# Patient Record
Sex: Female | Born: 1945 | Race: Black or African American | Hispanic: No | Marital: Single | Smoking: Current every day smoker
Health system: Southern US, Community
[De-identification: ages and names within clinical notes are randomized; demographics above are authoritative.]

## PROBLEM LIST (undated history)

## (undated) DIAGNOSIS — E049 Nontoxic goiter, unspecified: Secondary | ICD-10-CM

## (undated) DIAGNOSIS — K759 Inflammatory liver disease, unspecified: Secondary | ICD-10-CM

## (undated) DIAGNOSIS — K529 Noninfective gastroenteritis and colitis, unspecified: Secondary | ICD-10-CM

## (undated) DIAGNOSIS — K635 Polyp of colon: Secondary | ICD-10-CM

## (undated) DIAGNOSIS — I10 Essential (primary) hypertension: Secondary | ICD-10-CM

## (undated) DIAGNOSIS — K5909 Other constipation: Secondary | ICD-10-CM

## (undated) DIAGNOSIS — D649 Anemia, unspecified: Secondary | ICD-10-CM

## (undated) DIAGNOSIS — Z720 Tobacco use: Secondary | ICD-10-CM

## (undated) DIAGNOSIS — K8020 Calculus of gallbladder without cholecystitis without obstruction: Secondary | ICD-10-CM

## (undated) DIAGNOSIS — E785 Hyperlipidemia, unspecified: Secondary | ICD-10-CM

## (undated) HISTORY — PX: LIVER BIOPSY: SHX301

## (undated) HISTORY — PX: COLON SURGERY: SHX602

---

## 2009-12-29 ENCOUNTER — Ambulatory Visit: Payer: Self-pay | Admitting: Gastroenterology

## 2012-12-04 ENCOUNTER — Other Ambulatory Visit: Payer: Self-pay | Admitting: Gastroenterology

## 2012-12-04 LAB — CBC
HCT: 40 % (ref 35.0–47.0)
MCH: 27.7 pg (ref 26.0–34.0)
MCHC: 31.6 g/dL — ABNORMAL LOW (ref 32.0–36.0)
RBC: 4.56 10*6/uL (ref 3.80–5.20)

## 2012-12-04 LAB — APTT: Activated PTT: 35.5 secs (ref 23.6–35.9)

## 2012-12-05 ENCOUNTER — Ambulatory Visit: Payer: Self-pay | Admitting: Gastroenterology

## 2012-12-07 LAB — PATHOLOGY REPORT

## 2014-08-05 ENCOUNTER — Emergency Department: Payer: Self-pay | Admitting: Emergency Medicine

## 2014-08-05 LAB — COMPREHENSIVE METABOLIC PANEL
ALK PHOS: 114 U/L
Albumin: 3.9 g/dL (ref 3.4–5.0)
Anion Gap: 8 (ref 7–16)
BILIRUBIN TOTAL: 0.3 mg/dL (ref 0.2–1.0)
BUN: 12 mg/dL (ref 7–18)
CALCIUM: 9.1 mg/dL (ref 8.5–10.1)
Chloride: 105 mmol/L (ref 98–107)
Co2: 30 mmol/L (ref 21–32)
Creatinine: 0.82 mg/dL (ref 0.60–1.30)
Glucose: 93 mg/dL (ref 65–99)
OSMOLALITY: 284 (ref 275–301)
POTASSIUM: 3.6 mmol/L (ref 3.5–5.1)
SGOT(AST): 23 U/L (ref 15–37)
SGPT (ALT): 28 U/L
Sodium: 143 mmol/L (ref 136–145)
TOTAL PROTEIN: 8.6 g/dL — AB (ref 6.4–8.2)

## 2014-08-05 LAB — CBC WITH DIFFERENTIAL/PLATELET
BASOS ABS: 0 10*3/uL (ref 0.0–0.1)
Basophil %: 0.7 %
EOS ABS: 0.1 10*3/uL (ref 0.0–0.7)
Eosinophil %: 2.3 %
HCT: 41.5 % (ref 35.0–47.0)
HGB: 13.2 g/dL (ref 12.0–16.0)
Lymphocyte #: 2.1 10*3/uL (ref 1.0–3.6)
Lymphocyte %: 37.7 %
MCH: 28.3 pg (ref 26.0–34.0)
MCHC: 31.9 g/dL — ABNORMAL LOW (ref 32.0–36.0)
MCV: 89 fL (ref 80–100)
MONOS PCT: 8.8 %
Monocyte #: 0.5 x10 3/mm (ref 0.2–0.9)
NEUTROS PCT: 50.5 %
Neutrophil #: 2.9 10*3/uL (ref 1.4–6.5)
PLATELETS: 230 10*3/uL (ref 150–440)
RBC: 4.68 10*6/uL (ref 3.80–5.20)
RDW: 13.4 % (ref 11.5–14.5)
WBC: 5.7 10*3/uL (ref 3.6–11.0)

## 2014-08-05 LAB — URINALYSIS, COMPLETE
BILIRUBIN, UR: NEGATIVE
Bacteria: NONE SEEN
Glucose,UR: NEGATIVE mg/dL (ref 0–75)
Hyaline Cast: 1
Ketone: NEGATIVE
NITRITE: NEGATIVE
PROTEIN: NEGATIVE
Ph: 7 (ref 4.5–8.0)
RBC,UR: 9 /HPF (ref 0–5)
Specific Gravity: 1.012 (ref 1.003–1.030)

## 2014-08-05 LAB — LIPASE, BLOOD: LIPASE: 162 U/L (ref 73–393)

## 2014-10-31 ENCOUNTER — Ambulatory Visit: Payer: Self-pay | Admitting: Internal Medicine

## 2014-10-31 LAB — SEDIMENTATION RATE: Erythrocyte Sed Rate: 43 mm/hr — ABNORMAL HIGH (ref 0–30)

## 2014-11-05 ENCOUNTER — Ambulatory Visit: Payer: Self-pay | Admitting: Physician Assistant

## 2014-11-09 ENCOUNTER — Emergency Department: Payer: Self-pay | Admitting: Emergency Medicine

## 2014-11-09 LAB — CBC WITH DIFFERENTIAL/PLATELET
Basophil #: 0.3 10*3/uL — ABNORMAL HIGH (ref 0.0–0.1)
Basophil %: 3.4 %
EOS PCT: 4.2 %
Eosinophil #: 0.4 10*3/uL (ref 0.0–0.7)
HCT: 39.7 % (ref 35.0–47.0)
HGB: 13 g/dL (ref 12.0–16.0)
Lymphocyte #: 0.8 10*3/uL — ABNORMAL LOW (ref 1.0–3.6)
Lymphocyte %: 8.7 %
MCH: 28.6 pg (ref 26.0–34.0)
MCHC: 32.7 g/dL (ref 32.0–36.0)
MCV: 88 fL (ref 80–100)
Monocyte #: 0.6 x10 3/mm (ref 0.2–0.9)
Monocyte %: 6 %
Neutrophil #: 7.5 10*3/uL — ABNORMAL HIGH (ref 1.4–6.5)
Neutrophil %: 77.7 %
PLATELETS: 257 10*3/uL (ref 150–440)
RBC: 4.54 10*6/uL (ref 3.80–5.20)
RDW: 14 % (ref 11.5–14.5)
WBC: 9.6 10*3/uL (ref 3.6–11.0)

## 2014-11-09 LAB — URINALYSIS, COMPLETE
Bilirubin,UR: NEGATIVE
Glucose,UR: NEGATIVE mg/dL (ref 0–75)
KETONE: NEGATIVE
Nitrite: NEGATIVE
Ph: 6 (ref 4.5–8.0)
Protein: NEGATIVE
RBC,UR: 6 /HPF (ref 0–5)
Specific Gravity: 1.005 (ref 1.003–1.030)
Squamous Epithelial: 1
WBC UR: 2 /HPF (ref 0–5)

## 2014-11-09 LAB — COMPREHENSIVE METABOLIC PANEL
Albumin: 3.7 g/dL (ref 3.4–5.0)
Alkaline Phosphatase: 118 U/L — ABNORMAL HIGH
Anion Gap: 8 (ref 7–16)
BUN: 17 mg/dL (ref 7–18)
Bilirubin,Total: 0.3 mg/dL (ref 0.2–1.0)
CALCIUM: 8.7 mg/dL (ref 8.5–10.1)
CO2: 27 mmol/L (ref 21–32)
Chloride: 104 mmol/L (ref 98–107)
Creatinine: 1.05 mg/dL (ref 0.60–1.30)
EGFR (African American): >60
EGFR (Non-African Amer.): 55 — ABNORMAL LOW
Glucose: 106 mg/dL — ABNORMAL HIGH (ref 65–99)
Osmolality: 280 (ref 275–301)
Potassium: 3.2 mmol/L — ABNORMAL LOW (ref 3.5–5.1)
SGOT(AST): 46 U/L — ABNORMAL HIGH (ref 15–37)
SGPT (ALT): 47 U/L
SODIUM: 139 mmol/L (ref 136–145)
Total Protein: 8.8 g/dL — ABNORMAL HIGH (ref 6.4–8.2)

## 2014-11-09 LAB — MAGNESIUM: MAGNESIUM: 1.8 mg/dL

## 2014-11-09 LAB — LIPASE, BLOOD: Lipase: 241 U/L (ref 73–393)

## 2014-11-11 ENCOUNTER — Ambulatory Visit: Payer: Self-pay | Admitting: Physician Assistant

## 2014-11-11 LAB — URINALYSIS, COMPLETE
Bacteria: NEGATIVE
Bilirubin,UR: NEGATIVE
GLUCOSE, UR: NEGATIVE
Ketone: NEGATIVE
NITRITE: NEGATIVE
PROTEIN: NEGATIVE
Ph: 5.5 (ref 5.0–8.0)
SPECIFIC GRAVITY: 1.01 (ref 1.000–1.030)
Squamous Epithelial: NONE SEEN
WBC UR: NONE SEEN /HPF (ref 0–5)

## 2014-11-11 LAB — URINE CULTURE

## 2014-11-13 LAB — URINE CULTURE

## 2014-11-18 ENCOUNTER — Emergency Department: Payer: Self-pay | Admitting: Emergency Medicine

## 2014-11-19 LAB — URINALYSIS, COMPLETE
BILIRUBIN, UR: NEGATIVE
Bacteria: NONE SEEN
Glucose,UR: NEGATIVE mg/dL (ref 0–75)
KETONE: NEGATIVE
Leukocyte Esterase: NEGATIVE
Nitrite: NEGATIVE
PH: 6 (ref 4.5–8.0)
PROTEIN: NEGATIVE
RBC,UR: 3 /HPF (ref 0–5)
SPECIFIC GRAVITY: 1.003 (ref 1.003–1.030)
Squamous Epithelial: NONE SEEN
WBC UR: <1 /HPF (ref 0–5)

## 2014-11-19 LAB — COMPREHENSIVE METABOLIC PANEL
ALBUMIN: 4.1 g/dL (ref 3.4–5.0)
AST: 41 U/L — AB (ref 15–37)
Alkaline Phosphatase: 108 U/L
Anion Gap: 8 (ref 7–16)
BUN: 14 mg/dL (ref 7–18)
Bilirubin,Total: 0.4 mg/dL (ref 0.2–1.0)
CHLORIDE: 101 mmol/L (ref 98–107)
CO2: 27 mmol/L (ref 21–32)
CREATININE: 1.04 mg/dL (ref 0.60–1.30)
Calcium, Total: 9.1 mg/dL (ref 8.5–10.1)
EGFR (African American): >60
EGFR (Non-African Amer.): 56 — ABNORMAL LOW
Glucose: 90 mg/dL (ref 65–99)
OSMOLALITY: 272 (ref 275–301)
POTASSIUM: 3.3 mmol/L — AB (ref 3.5–5.1)
SGPT (ALT): 30 U/L
SODIUM: 136 mmol/L (ref 136–145)
TOTAL PROTEIN: 9.1 g/dL — AB (ref 6.4–8.2)

## 2014-11-19 LAB — CBC
HCT: 37.9 % (ref 35.0–47.0)
HGB: 12.2 g/dL (ref 12.0–16.0)
MCH: 28.4 pg (ref 26.0–34.0)
MCHC: 32.1 g/dL (ref 32.0–36.0)
MCV: 88 fL (ref 80–100)
PLATELETS: 306 10*3/uL (ref 150–440)
RBC: 4.29 10*6/uL (ref 3.80–5.20)
RDW: 13.8 % (ref 11.5–14.5)
WBC: 9.4 10*3/uL (ref 3.6–11.0)

## 2014-11-19 LAB — TSH: THYROID STIMULATING HORM: 5.17 u[IU]/mL — AB

## 2014-11-19 LAB — ETHANOL: Ethanol: <3 mg/dL

## 2014-11-19 LAB — TROPONIN I

## 2014-11-22 ENCOUNTER — Emergency Department: Payer: Self-pay | Admitting: Emergency Medicine

## 2014-11-27 ENCOUNTER — Ambulatory Visit: Payer: Self-pay | Admitting: Family Medicine

## 2014-12-03 ENCOUNTER — Ambulatory Visit: Payer: Self-pay | Admitting: Family Medicine

## 2014-12-23 ENCOUNTER — Ambulatory Visit: Payer: Self-pay | Admitting: Physician Assistant

## 2014-12-23 LAB — URINALYSIS, COMPLETE
BACTERIA: NEGATIVE
Bilirubin,UR: NEGATIVE
Glucose,UR: NEGATIVE
KETONE: NEGATIVE
NITRITE: NEGATIVE
PROTEIN: NEGATIVE
Ph: 5 (ref 5.0–8.0)
Specific Gravity: 1.005 (ref 1.000–1.030)

## 2014-12-23 LAB — RAPID STREP-A WITH REFLX: Micro Text Report: NEGATIVE

## 2014-12-26 LAB — BETA STREP CULTURE(ARMC)

## 2015-01-17 ENCOUNTER — Ambulatory Visit: Payer: Self-pay | Admitting: Family Medicine

## 2015-01-21 ENCOUNTER — Ambulatory Visit: Payer: Self-pay | Admitting: Family Medicine

## 2015-03-14 ENCOUNTER — Encounter: Payer: Self-pay | Admitting: Family Medicine

## 2015-03-14 DIAGNOSIS — R202 Paresthesia of skin: Secondary | ICD-10-CM

## 2015-03-14 DIAGNOSIS — Z789 Other specified health status: Secondary | ICD-10-CM

## 2015-03-14 DIAGNOSIS — I10 Essential (primary) hypertension: Secondary | ICD-10-CM | POA: Insufficient documentation

## 2015-03-14 DIAGNOSIS — I872 Venous insufficiency (chronic) (peripheral): Secondary | ICD-10-CM | POA: Insufficient documentation

## 2015-03-14 DIAGNOSIS — E559 Vitamin D deficiency, unspecified: Secondary | ICD-10-CM | POA: Insufficient documentation

## 2015-03-26 NOTE — Consult Note (Signed)
PATIENT NAME:  Angela Yates, Angela Yates MR#:  [id number] DATE OF BIRTH:  1946-07-21  DATE OF CONSULTATION:  11/20/2014  REFERRING PHYSICIAN:   CONSULTING PHYSICIAN:  Gonzella Lex, MD  IDENTIFYING INFORMATION AND REASON FOR CONSULT: A 69 year old woman who came voluntarily to the Emergency Room.   CHIEF COMPLAINT: Feeling like she has worms crawling in her.    HISTORY OF PRESENT ILLNESS: Information obtained from the patient and less so from the chart. She did not come with any referral paperwork, she is not under an IVC. She says that she went to the walk-in clinic and they referred her to come to the Emergency Room. She says that she has a feeling like there are worms crawling under her skin. It is not constant, but will last for 30-60 minutes a day. Some days it is worse than others. Altogether it has been going on for a month. She notices that if she drinks alcohol it makes it better. She has not talked to a doctor about it until now. The patient denies being depressed. Denies suicidal ideation. Denies auditory hallucinations. No other psychiatric symptoms reported. Does not have any physical symptoms she is reporting either. She says that she takes hydrochlorothiazide and potassium and lisinopril, does not know of any other medicines. Denies that she uses any other drugs besides the alcohol. Says she drinks about 1 small bottle like an airplane-size bottle a day and not more than that.   PAST PSYCHIATRIC HISTORY: Denies any history of psychiatric problems. Never been in a psychiatric hospital. Denies suicide attempts or violence. Denies any past history of hallucinations. Never been on any psychiatric medicines.   FAMILY HISTORY: Denies family history of mental illness.   SOCIAL HISTORY: The patient lives by herself in an apartment. Has several adult children, 1 of whom, a daughter checks in on her with some regularity.   PAST MEDICAL HISTORY: High blood pressure is the only condition she knows she  has. Denies any history of stroke or MI.    SUBSTANCE ABUSE HISTORY: Says she drinks about 1 single 1 ounce drink of whiskey a day, does not drink more than that, has never thought it is a problem. No history of DTs or seizures. Denies use of any other drugs.   REVIEW OF SYSTEMS: The only symptom she is complaining of to me is feeling like there are worms crawling under her skin. Denies any other physical symptoms. No mood symptoms. No psychotic symptoms otherwise.   MENTAL STATUS EXAMINATION: Slightly disheveled woman, looks her stated age, cooperative with the interview. Eye contact is intermittent. She does have an odd gesture of squinting and making odd facial gestures intermittently. When this is pointed out to her she cannot really give any account of it. Otherwise her speech is normal in rate, tone, and volume. Her affect is blunted. Mood is stated as all right. Thoughts sometimes a little scattered. She will go on and on at some length until I stop her with a tangential answer. Did not make any obviously bizarre or paranoid statements talking with me. It was reported earlier that she said she was pregnant, she did not report that to me. She denies suicidal or homicidal ideation. Denies auditory or visual hallucinations. She could recall 3 out of 3 objects immediately, 3 out of 3 at 3 minutes. She was alert and oriented x 4. Judgment and insight seem reasonably intact. Baseline intelligence probably average to low average.   LABORATORY RESULTS: Chemistry panel shows a low  potassium of 3.3, otherwise unremarkable. CBC is all normal. TSH elevated at 5.1. EKG, possible left atrial enlargement or ventricular hypertrophy. Nothing that looks related to the psychiatric problems. Blood in the urine. A head CT which was obtained shows nothing acute, mild small vessel disease.   VITAL SIGNS:  Currently blood pressure 155/72, respirations 18, pulse 79, temperature 98.7.   ASSESSMENT: A 69 year old woman with  a history of no past psychiatric problems that we know of, presents with tactile sensations. Possibly hallucinations, possibly caused by something else. At this point I do not have any evidence that she is giving of any specific psychiatric problem. She does not report any mood symptoms. It is possible this could be part of a long-standing psychotic disorder, but without any past history or other symptoms I cannot really make that diagnosis. At this point she does not seem to be dangerous, does not require hospital treatment or have enough symptoms to warrant any specific medication.   TREATMENT PLAN: The patient is encouraged that whatever this is, it does not seem to be immediately life-threatening and that she should be discharged from the Emergency Room. She is encouraged to follow up with her primary care doctor. I also strongly encouraged her to stop the use of alcohol. I have a little bit of a suspicion that this could be related to the alcohol and may be alcohol withdrawal. This was explained to the patient and she was encouraged to stop drinking entirely. Case discussed with Emergency Room doctor. No further treatment at this point.   DIAGNOSIS PRINCIPAL AND PRIMARY:   AXIS I: Psychosis not otherwise specified.     SECONDARY DIAGNOSES:   AXIS I: No further.   AXIS II: No diagnosis.   AXIS III: High blood pressure.    ____________________________ Gonzella Lex, MD jtc:bu D: 11/20/2014 14:17:02 ET T: 11/20/2014 14:49:41 ET JOB#: [id number]  cc: Gonzella Lex, MD, <Dictator> Gonzella Lex MD ELECTRONICALLY SIGNED 12/02/2014 15:14

## 2015-08-11 ENCOUNTER — Encounter: Payer: Self-pay | Admitting: Emergency Medicine

## 2015-08-11 ENCOUNTER — Emergency Department
Admission: EM | Admit: 2015-08-11 | Discharge: 2015-08-11 | Disposition: A | Discharge status: Home / Self Care | Payer: Medicare HMO | Attending: Emergency Medicine | Admitting: Emergency Medicine

## 2015-08-11 DIAGNOSIS — R1032 Left lower quadrant pain: Secondary | ICD-10-CM

## 2015-08-11 DIAGNOSIS — Z79899 Other long term (current) drug therapy: Secondary | ICD-10-CM | POA: Diagnosis not present

## 2015-08-11 DIAGNOSIS — I10 Essential (primary) hypertension: Secondary | ICD-10-CM | POA: Diagnosis not present

## 2015-08-11 DIAGNOSIS — N390 Urinary tract infection, site not specified: Secondary | ICD-10-CM | POA: Insufficient documentation

## 2015-08-11 DIAGNOSIS — Z720 Tobacco use: Secondary | ICD-10-CM | POA: Insufficient documentation

## 2015-08-11 HISTORY — DX: Essential (primary) hypertension: I10

## 2015-08-11 LAB — CBC WITH DIFFERENTIAL/PLATELET
BASOS ABS: 0 10*3/uL (ref 0–0.1)
BASOS PCT: 1 %
Eosinophils Absolute: 0.1 10*3/uL (ref 0–0.7)
Eosinophils Relative: 2 %
HEMATOCRIT: 37.8 % (ref 35.0–47.0)
HEMOGLOBIN: 12.2 g/dL (ref 12.0–16.0)
Lymphocytes Relative: 25 %
Lymphs Abs: 1.7 10*3/uL (ref 1.0–3.6)
MCH: 28.1 pg (ref 26.0–34.0)
MCHC: 32.4 g/dL (ref 32.0–36.0)
MCV: 86.6 fL (ref 80.0–100.0)
Monocytes Absolute: 0.8 10*3/uL (ref 0.2–0.9)
Monocytes Relative: 11 %
NEUTROS ABS: 4.2 10*3/uL (ref 1.4–6.5)
NEUTROS PCT: 61 %
Platelets: 254 10*3/uL (ref 150–440)
RBC: 4.36 MIL/uL (ref 3.80–5.20)
RDW: 14.1 % (ref 11.5–14.5)
WBC: 6.9 10*3/uL (ref 3.6–11.0)

## 2015-08-11 LAB — URINALYSIS COMPLETE WITH MICROSCOPIC (ARMC ONLY)
Bilirubin Urine: NEGATIVE
Glucose, UA: NEGATIVE mg/dL
Ketones, ur: NEGATIVE mg/dL
Nitrite: NEGATIVE
PH: 5 (ref 5.0–8.0)
PROTEIN: NEGATIVE mg/dL
SPECIFIC GRAVITY, URINE: 1.008 (ref 1.005–1.030)

## 2015-08-11 LAB — COMPREHENSIVE METABOLIC PANEL
ALBUMIN: 4.3 g/dL (ref 3.5–5.0)
ALK PHOS: 63 U/L (ref 38–126)
ALT: 18 U/L (ref 14–54)
AST: 27 U/L (ref 15–41)
Anion gap: 9 (ref 5–15)
BILIRUBIN TOTAL: 0.6 mg/dL (ref 0.3–1.2)
BUN: 17 mg/dL (ref 6–20)
CO2: 23 mmol/L (ref 22–32)
Calcium: 8.9 mg/dL (ref 8.9–10.3)
Chloride: 101 mmol/L (ref 101–111)
Creatinine, Ser: 1.11 mg/dL — ABNORMAL HIGH (ref 0.44–1.00)
GFR calc Af Amer: 57 mL/min — ABNORMAL LOW (ref >60)
GFR calc non Af Amer: 49 mL/min — ABNORMAL LOW (ref >60)
GLUCOSE: 139 mg/dL — AB (ref 65–99)
POTASSIUM: 3.1 mmol/L — AB (ref 3.5–5.1)
Sodium: 133 mmol/L — ABNORMAL LOW (ref 135–145)
TOTAL PROTEIN: 8.2 g/dL — AB (ref 6.5–8.1)

## 2015-08-11 LAB — LIPASE, BLOOD: Lipase: 39 U/L (ref 22–51)

## 2015-08-11 MED ORDER — CEPHALEXIN 500 MG PO CAPS: 500.0000 mg | ORAL_CAPSULE | Freq: Three times a day (TID) | ORAL | Status: AC

## 2015-08-11 MED ORDER — CEPHALEXIN 500 MG PO CAPS
500.0000 mg | ORAL_CAPSULE | Freq: Once | ORAL | Status: AC
  Administered 2015-08-11: 500 mg via ORAL
  Filled 2015-08-11 (×2): qty 1

## 2015-08-11 NOTE — Discharge Instructions (Signed)
Abdominal Pain °Many things can cause abdominal pain. Usually, abdominal pain is not caused by a disease and will improve without treatment. It can often be observed and treated at home. Your health care provider will do a physical exam and possibly order blood tests and X-rays to help determine the seriousness of your pain. However, in many cases, more time must pass before a clear cause of the pain can be found. Before that point, your health care provider may not know if you need more testing or further treatment. °HOME CARE INSTRUCTIONS  °Monitor your abdominal pain for any changes. The following actions may help to alleviate any discomfort you are experiencing: °· Only take over-the-counter or prescription medicines as directed by your health care provider. °· Do not take laxatives unless directed to do so by your health care provider. °· Try a clear liquid diet (broth, tea, or water) as directed by your health care provider. Slowly move to a bland diet as tolerated. °SEEK MEDICAL CARE IF: °· You have unexplained abdominal pain. °· You have abdominal pain associated with nausea or diarrhea. °· You have pain when you urinate or have a bowel movement. °· You experience abdominal pain that wakes you in the night. °· You have abdominal pain that is worsened or improved by eating food. °· You have abdominal pain that is worsened with eating fatty foods. °· You have a fever. °SEEK IMMEDIATE MEDICAL CARE IF:  °· Your pain does not go away within 2 hours. °· You keep throwing up (vomiting). °· Your pain is felt only in portions of the abdomen, such as the right side or the left lower portion of the abdomen. °· You pass bloody or black tarry stools. °MAKE SURE YOU: °· Understand these instructions.   °· Will watch your condition.   °· Will get help right away if you are not doing well or get worse.   °Document Released: 08/25/2005 Document Revised: 11/20/2013 Document Reviewed: 07/25/2013 °[employer]® Patient Information  ©2015 [employer]. This information is not intended to replace advice given to you by your health care provider. Make sure you discuss any questions you have with your health care provider. °Urinary Tract Infection °Urinary tract infections (UTIs) can develop anywhere along your urinary tract. Your urinary tract is your body's drainage system for removing wastes and extra water. Your urinary tract includes two kidneys, two ureters, a bladder, and a urethra. Your kidneys are a pair of bean-shaped organs. Each kidney is about the size of your fist. They are located below your ribs, one on each side of your spine. °CAUSES °Infections are caused by microbes, which are microscopic organisms, including fungi, viruses, and bacteria. These organisms are so small that they can only be seen through a microscope. Bacteria are the microbes that most commonly cause UTIs. °SYMPTOMS  °Symptoms of UTIs may vary by age and gender of the patient and by the location of the infection. Symptoms in young women typically include a frequent and intense urge to urinate and a painful, burning feeling in the bladder or urethra during urination. Older women and men are more likely to be tired, shaky, and weak and have muscle aches and abdominal pain. A fever may mean the infection is in your kidneys. Other symptoms of a kidney infection include pain in your back or sides below the ribs, nausea, and vomiting. °DIAGNOSIS °To diagnose a UTI, your caregiver will ask you about your symptoms. Your caregiver also will ask to provide a urine sample. The urine sample   will be tested for bacteria and white blood cells. White blood cells are made by your body to help fight infection. °TREATMENT  °Typically, UTIs can be treated with medication. Because most UTIs are caused by a bacterial infection, they usually can be treated with the use of antibiotics. The choice of antibiotic and length of treatment depend on your symptoms and the type of bacteria  causing your infection. °HOME CARE INSTRUCTIONS °· If you were prescribed antibiotics, take them exactly as your caregiver instructs you. Finish the medication even if you feel better after you have only taken some of the medication. °· Drink enough water and fluids to keep your urine clear or pale yellow. °· Avoid caffeine, tea, and carbonated beverages. They tend to irritate your bladder. °· Empty your bladder often. Avoid holding urine for long periods of time. °· Empty your bladder before and after sexual intercourse. °· After a bowel movement, women should cleanse from front to back. Use each tissue only once. °SEEK MEDICAL CARE IF:  °· You have back pain. °· You develop a fever. °· Your symptoms do not begin to resolve within 3 days. °SEEK IMMEDIATE MEDICAL CARE IF:  °· You have severe back pain or lower abdominal pain. °· You develop chills. °· You have nausea or vomiting. °· You have continued burning or discomfort with urination. °MAKE SURE YOU:  °· Understand these instructions. °· Will watch your condition. °· Will get help right away if you are not doing well or get worse. °Document Released: 08/25/2005 Document Revised: 05/16/2012 Document Reviewed: 12/24/2011 °ExitCare® Patient Information ©2015 [employer]. This information is not intended to replace advice given to you by your health care provider. Make sure you discuss any questions you have with your health care provider. ° °

## 2015-08-11 NOTE — ED Notes (Signed)
Pt denies NVD and denies changes in urine.

## 2015-08-11 NOTE — ED Notes (Signed)
Pt here with c/o lower abd pain. Pt stated that she has had pain for a week, pt increased today. Not been eating well.  Pt denies n/v/d.  Pt states that she had a BM while in the waiting room and it was normal.  Pt in NAD at this time and was ambulatory to traige room.

## 2015-08-11 NOTE — ED Notes (Signed)
Pt is very scattered when talking with this Probation officer.  Pt denies using drugs or taking any meds for mental health issues.

## 2015-08-11 NOTE — ED Provider Notes (Signed)
[facility] Emergency Department Provider Note  ____________________________________________  Time seen: Approximately 350 PM  I have reviewed the triage vital signs and the nursing notes.   HISTORY  Chief Complaint Abdominal Pain    HPI Angela Yates is a 69 y.o. female with a history of hypertension who is complaining of one week of left lower quadrant abdominal pain. She denies any nausea vomiting or diarrhea. She says that she does have a small amount of burning after urinating. Denies any urinary frequency. Denies any vaginal discharge or bleeding. Says that the pain feels sharp. Denies any pain at this time. Says she had 2 bowel movements earlier today and the pain is now gone. Denies any chest pain.   Past Medical History  Diagnosis Date  . Hypertension     Patient Active Problem List   Diagnosis Date Noted  . Hypertension, essential 03/14/2015  . Vitamin D deficiency 03/14/2015  . 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 Formication 03/14/2015  . Poor historian 03/14/2015    History reviewed. No pertinent past surgical history.  Current Outpatient Rx  Name  Route  Sig  Dispense  Refill  . cholecalciferol (VITAMIN D) 1000 UNITS tablet   Oral   Take 2,000 Units by mouth daily.         . hydrochlorothiazide (HYDRODIURIL) 25 MG tablet   Oral   Take 25 mg by mouth daily.           Allergies Review of patient's allergies indicates no known allergies.  No family history on file.  Social History Social History  Substance Use Topics  . Smoking status: Current Every Day Smoker  . Smokeless tobacco: None  . Alcohol Use: Yes    Review of Systems Constitutional: No fever/chills Eyes: No visual changes. ENT: No sore throat. Cardiovascular: Denies chest pain. Respiratory: Denies shortness of breath. Gastrointestinal:  No nausea, no vomiting.  No diarrhea.  No constipation. Genitourinary:  As above Musculoskeletal: Negative for back pain. Skin: Negative for rash. Neurological: Negative for headaches, focal weakness or numbness.  10-point ROS otherwise negative.  ____________________________________________   PHYSICAL EXAM:  VITAL SIGNS: ED Triage Vitals  Enc Vitals Group     BP 08/11/15 1323 134/80 mmHg     Pulse Rate 08/11/15 1323 95     Resp 08/11/15 1323 18     Temp 08/11/15 1323 98.9 F (37.2 C)     Temp Source 08/11/15 1323 Oral     SpO2 08/11/15 1323 95 %     Weight 08/11/15 1323 165 lb (74.844 kg)     Height --      Head Cir --      Peak Flow --      Pain Score --      Pain Loc --      Pain Edu? --      Excl. in [place]? --     Constitutional: Alert and oriented. Well appearing and in no acute distress. Eyes: Conjunctivae are normal. PERRL. EOMI. Head: Atraumatic. Nose: No congestion/rhinnorhea. Mouth/Throat: Mucous membranes are moist.  Oropharynx non-erythematous. Neck: No stridor.   Cardiovascular: Normal rate, regular rhythm. Grossly normal heart sounds.  Good peripheral circulation. Respiratory: Normal respiratory effort.  No retractions. Lungs CTAB. Gastrointestinal: Soft and nontender. No distention. No abdominal bruits. No CVA tenderness. Musculoskeletal: No lower extremity tenderness nor edema.  No joint effusions. Neurologic:  Normal speech and language. No gross focal neurologic deficits are appreciated. No gait  instability. Skin:  Skin is warm, dry and intact. No rash noted. Psychiatric: Mood and affect are normal. Speech and behavior are normal.  ____________________________________________   LABS (all labs ordered are listed, but only abnormal results are displayed)  Labs Reviewed  COMPREHENSIVE METABOLIC PANEL - Abnormal; Notable for the following:    Sodium 133 (*)    Potassium 3.1 (*)    Glucose, Bld 139 (*)    Creatinine, Ser 1.11 (*)    Total Protein 8.2 (*)    GFR calc non Af Amer 49 (*)    GFR calc Af Amer 57 (*)    All  other components within normal limits  URINALYSIS COMPLETEWITH MICROSCOPIC ([facility] ONLY) - Abnormal; Notable for the following:    Color, Urine YELLOW (*)    APPearance HAZY (*)    Hgb urine dipstick 1+ (*)    Leukocytes, UA 3+ (*)    Bacteria, UA MANY (*)    Squamous Epithelial / LPF 0-5 (*)    All other components within normal limits  URINE CULTURE  CBC WITH DIFFERENTIAL/PLATELET  LIPASE, BLOOD   ____________________________________________  EKG  ED ECG REPORT I, Doran Stabler, the attending physician, personally viewed and interpreted this ECG.   Date: 08/11/2015  EKG Time: 1338  Rate: 106  Rhythm: sinus tachycardia  Axis: Normal axis  Intervals:none  ST&T Change: No ST elevations or depressions. No abnormal T-wave inversions.  ____________________________________________  RADIOLOGY   ____________________________________________   PROCEDURES    ____________________________________________   INITIAL IMPRESSION / ASSESSMENT AND PLAN / ED COURSE  Pertinent labs & imaging results that were available during my care of the patient were reviewed by me and considered in my medical decision making (see chart for details).  ----------------------------------------- 4:34 PM on 08/11/2015 -----------------------------------------  Patient is resting comfortable at this time. Discussed lab results including positive urinalysis. We'll discharge with Keflex. ____________________________________________   FINAL CLINICAL IMPRESSION(S) / ED DIAGNOSES  Acute abdominal pain. Acute urinary tract infection. Initial visit.    Orbie Pyo, MD 08/11/15 [phone]

## 2015-08-13 LAB — URINE CULTURE

## 2015-10-29 ENCOUNTER — Other Ambulatory Visit: Payer: Self-pay | Admitting: Family Medicine

## 2015-10-29 DIAGNOSIS — Z1231 Encounter for screening mammogram for malignant neoplasm of breast: Secondary | ICD-10-CM

## 2015-10-29 DIAGNOSIS — R935 Abnormal findings on diagnostic imaging of other abdominal regions, including retroperitoneum: Secondary | ICD-10-CM

## 2015-11-03 ENCOUNTER — Ambulatory Visit
Admission: RE | Admit: 2015-11-03 | Discharge: 2015-11-03 | Disposition: A | Discharge status: Home / Self Care | Payer: Medicare HMO | Source: Ambulatory Visit | Attending: Family Medicine | Admitting: Family Medicine

## 2015-11-03 DIAGNOSIS — R1032 Left lower quadrant pain: Secondary | ICD-10-CM | POA: Diagnosis not present

## 2015-11-03 DIAGNOSIS — R935 Abnormal findings on diagnostic imaging of other abdominal regions, including retroperitoneum: Secondary | ICD-10-CM

## 2015-11-11 ENCOUNTER — Ambulatory Visit
Admission: RE | Admit: 2015-11-11 | Discharge: 2015-11-11 | Disposition: A | Discharge status: Home / Self Care | Payer: Medicare HMO | Source: Ambulatory Visit | Attending: Family Medicine | Admitting: Family Medicine

## 2015-11-11 DIAGNOSIS — Z1231 Encounter for screening mammogram for malignant neoplasm of breast: Secondary | ICD-10-CM | POA: Diagnosis not present

## 2016-01-07 ENCOUNTER — Emergency Department: Payer: Medicare HMO

## 2016-01-07 ENCOUNTER — Encounter: Payer: Self-pay | Admitting: Emergency Medicine

## 2016-01-07 ENCOUNTER — Emergency Department
Admission: EM | Admit: 2016-01-07 | Discharge: 2016-01-07 | Disposition: A | Discharge status: Home / Self Care | Payer: Medicare HMO | Attending: Emergency Medicine | Admitting: Emergency Medicine

## 2016-01-07 DIAGNOSIS — M79673 Pain in unspecified foot: Secondary | ICD-10-CM | POA: Insufficient documentation

## 2016-01-07 DIAGNOSIS — Z79899 Other long term (current) drug therapy: Secondary | ICD-10-CM | POA: Insufficient documentation

## 2016-01-07 DIAGNOSIS — G8929 Other chronic pain: Secondary | ICD-10-CM | POA: Diagnosis not present

## 2016-01-07 DIAGNOSIS — R1030 Lower abdominal pain, unspecified: Secondary | ICD-10-CM | POA: Diagnosis not present

## 2016-01-07 DIAGNOSIS — R197 Diarrhea, unspecified: Secondary | ICD-10-CM

## 2016-01-07 DIAGNOSIS — F17200 Nicotine dependence, unspecified, uncomplicated: Secondary | ICD-10-CM | POA: Insufficient documentation

## 2016-01-07 DIAGNOSIS — I10 Essential (primary) hypertension: Secondary | ICD-10-CM | POA: Insufficient documentation

## 2016-01-07 LAB — COMPREHENSIVE METABOLIC PANEL
ALK PHOS: 76 U/L (ref 38–126)
ALT: 28 U/L (ref 14–54)
AST: 28 U/L (ref 15–41)
Albumin: 4.5 g/dL (ref 3.5–5.0)
Anion gap: 8 (ref 5–15)
BILIRUBIN TOTAL: 0.8 mg/dL (ref 0.3–1.2)
BUN: 24 mg/dL — AB (ref 6–20)
CHLORIDE: 102 mmol/L (ref 101–111)
CO2: 25 mmol/L (ref 22–32)
Calcium: 9.2 mg/dL (ref 8.9–10.3)
Creatinine, Ser: 1.13 mg/dL — ABNORMAL HIGH (ref 0.44–1.00)
GFR calc Af Amer: 55 mL/min — ABNORMAL LOW (ref >60)
GFR, EST NON AFRICAN AMERICAN: 48 mL/min — AB (ref >60)
GLUCOSE: 101 mg/dL — AB (ref 65–99)
Potassium: 3.5 mmol/L (ref 3.5–5.1)
Sodium: 135 mmol/L (ref 135–145)
Total Protein: 8.9 g/dL — ABNORMAL HIGH (ref 6.5–8.1)

## 2016-01-07 LAB — CBC WITH DIFFERENTIAL/PLATELET
Basophils Absolute: 0 10*3/uL (ref 0–0.1)
Basophils Relative: 1 %
Eosinophils Absolute: 0.1 10*3/uL (ref 0–0.7)
Eosinophils Relative: 2 %
HEMATOCRIT: 37.8 % (ref 35.0–47.0)
HEMOGLOBIN: 12.4 g/dL (ref 12.0–16.0)
LYMPHS ABS: 1.5 10*3/uL (ref 1.0–3.6)
LYMPHS PCT: 28 %
MCH: 28.1 pg (ref 26.0–34.0)
MCHC: 32.8 g/dL (ref 32.0–36.0)
MCV: 85.5 fL (ref 80.0–100.0)
MONOS PCT: 9 %
Monocytes Absolute: 0.5 10*3/uL (ref 0.2–0.9)
NEUTROS ABS: 3.2 10*3/uL (ref 1.4–6.5)
NEUTROS PCT: 60 %
Platelets: 255 10*3/uL (ref 150–440)
RBC: 4.42 MIL/uL (ref 3.80–5.20)
RDW: 13.7 % (ref 11.5–14.5)
WBC: 5.4 10*3/uL (ref 3.6–11.0)

## 2016-01-07 LAB — LIPASE, BLOOD: Lipase: 45 U/L (ref 11–51)

## 2016-01-07 LAB — TROPONIN I: Troponin I: <0.03 ng/mL (ref <0.031)

## 2016-01-07 LAB — LACTIC ACID, PLASMA: Lactic Acid, Venous: 1.3 mmol/L (ref 0.5–2.0)

## 2016-01-07 MED ORDER — IOHEXOL 300 MG/ML  SOLN
100.0000 mL | Freq: Once | INTRAMUSCULAR | Status: AC | PRN
  Administered 2016-01-07: 100 mL via INTRAVENOUS

## 2016-01-07 MED ORDER — ONDANSETRON HCL 4 MG PO TABS: 4.0000 mg | ORAL_TABLET | Freq: Every day | ORAL | Status: DC | PRN

## 2016-01-07 MED ORDER — IOHEXOL 240 MG/ML SOLN
25.0000 mL | Freq: Once | INTRAMUSCULAR | Status: AC | PRN
  Administered 2016-01-07: 25 mL via ORAL

## 2016-01-07 MED ORDER — SODIUM CHLORIDE 0.9 % IV BOLUS (SEPSIS)
500.0000 mL | Freq: Once | INTRAVENOUS | Status: AC
  Administered 2016-01-07: 500 mL via INTRAVENOUS

## 2016-01-07 NOTE — ED Notes (Signed)
Pt to ed with c/o abd pain x 3 days, denies n/v/d.  Also reports feet pain.  Pt reports pain in first toe and up into ankle area.  Pt reports mild swelling in feet and ankles intermittently.

## 2016-01-07 NOTE — ED Provider Notes (Addendum)
[facility] Emergency Department Provider Note  ____________________________________________   I have reviewed the triage vital signs and the nursing notes.   HISTORY  Chief Complaint Abdominal Pain    HPI Angela Yates is a 70 y.o. female who presents today complaining of abdominal pain. Patient states that she has had abdominal pain for last 2-3 days getting gradually worse. She has had loose stools but no melena or bright red blood per rectum. She has had nausea but to me she denies vomiting. She denies fever. She states she felt slightly warm however. She denies any chest pain or shortness of breath. She states the abdominal pain is diffuse and in the lower abdomen. Nothing makes it better nothing makes it worse. She denies history of diabetes she denies history of abdominal surgery that she can recollect in the past. The patient states that she also has chronic foot pain,which has been there for many years and sometimes "flares up". Her feet are not hurting right now but she was making while she was here perhaps I could talk to her about that as well.   Past Medical History  Diagnosis Date  . Hypertension     Patient Active Problem List   Diagnosis Date Noted  . Hypertension, essential 03/14/2015  . Vitamin D deficiency 03/14/2015  . 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 Formication 03/14/2015  . Poor historian 03/14/2015    History reviewed. No pertinent past surgical history.  Current Outpatient Rx  Name  Route  Sig  Dispense  Refill  . cholecalciferol (VITAMIN D) 1000 UNITS tablet   Oral   Take 2,000 Units by mouth daily.         . hydrochlorothiazide (HYDRODIURIL) 25 MG tablet   Oral   Take 25 mg by mouth daily.           Allergies Review of patient's allergies indicates no known allergies.  History reviewed. No pertinent family history.  Social History Social History  Substance Use  Topics  . Smoking status: Current Every Day Smoker  . Smokeless tobacco: None  . Alcohol Use: Yes    Review of Systems Constitutional: No fever/chills Eyes: No visual changes. ENT: No sore throat. No stiff neck no neck pain Cardiovascular: Denies chest pain. Respiratory: Denies shortness of breath. Gastrointestinal:   no vomiting.  Positive diarrhea.  No constipation. Genitourinary: Negative for dysuria. Musculoskeletal: Negative lower extremity swelling Skin: Negative for rash. Neurological: Negative for headaches, focal weakness or numbness. 10-point ROS otherwise negative.  ____________________________________________   PHYSICAL EXAM:  VITAL SIGNS: ED Triage Vitals  Enc Vitals Group     BP 01/07/16 0804 180/90 mmHg     Pulse Rate 01/07/16 0804 111     Resp 01/07/16 0804 20     Temp 01/07/16 0804 97.8 F (36.6 C)     Temp Source 01/07/16 0804 Oral     SpO2 01/07/16 0804 98 %     Weight 01/07/16 0804 165 lb (74.844 kg)     Height 01/07/16 0804 5\' 7"  (1.702 m)     Head Cir --      Peak Flow --      Pain Score 01/07/16 0804 5     Pain Loc --      Pain Edu? --      Excl. in [place]? --     Constitutional: Alert and oriented. Well appearing and in no acute distress. Eyes: Conjunctivae are normal. PERRL. EOMI. Head:  Atraumatic. Nose: No congestion/rhinnorhea. Mouth/Throat: Mucous membranes are moist.  Oropharynx non-erythematous. Neck: No stridor.   Nontender with no meningismus Cardiovascular: Normal rate, regular rhythm. Grossly normal heart sounds.  Good peripheral circulation. Respiratory: Normal respiratory effort.  No retractions. Lungs CTAB. Abdominal: Positive mild distention positive bowel sounds diffuse mild tenderness especially in the bilateral lower abdomen left greater than right, no guarding or rebound Back:  There is no focal tenderness or step off there is no midline tenderness there are no lesions noted. there is no CVA tenderness Musculoskeletal: No  lower extremity tenderness. No joint effusions, no DVT signs strong distal pulses no edema, symmetric appearing lower extremities sensation is normal Neurologic:  Normal speech and language. No gross focal neurologic deficits are appreciated.  Skin:  Skin is warm, dry and intact. No rash noted. Psychiatric: Mood and affect are normal. Speech and behavior are normal.  ____________________________________________   LABS (all labs ordered are listed, but only abnormal results are displayed)  Labs Reviewed  COMPREHENSIVE METABOLIC PANEL  LIPASE, BLOOD  TROPONIN I  CBC WITH DIFFERENTIAL/PLATELET  LACTIC ACID, PLASMA  LACTIC ACID, PLASMA  CBG MONITORING, ED   ____________________________________________  EKG  I personally interpreted any EKGs ordered by me or triage Sinus rhythm rate 89 bpm no acute ST elevation or acute ST depression wandering baseline limits interpretation normal axis ____________________________________________  RADIOLOGY  I reviewed any imaging ordered by me or triage that were performed during my shift ____________________________________________   PROCEDURES  Procedure(s) performed: None  Critical Care performed: None  ____________________________________________   INITIAL IMPRESSION / ASSESSMENT AND PLAN / ED COURSE  Pertinent labs & imaging results that were available during my care of the patient were reviewed by me and considered in my medical decision making (see chart for details).  Given patient age, we'll obtain a CT scan of her abdomen for abdominal pain, she does not have a surgical abdomen but she is in her 67s and likely would require imaging to rule out diverticular other pathologies second causes. Low suspicion for ischemic gut. We will check blood work and reassess for her abdominal pain. As far as her chronic leg pain is concerned I do not see anything acute today, however we will certainly advised her to follow-up closely with  outpatient PCP. No evidence of DVT or cellulitis no evidence of compartment syndrome no evidence of neurologic deficit no evidence of vascular compromise no evidence of cauda equina syndrome, cannot rule out sciatica at this time as the patient is not actually having symptoms. If her chronic pains should come back or she is here I will reassess.  ----------------------------------------- 11:33 AM on 01/07/2016 -----------------------------------------  No evidence of ongoing pain. Patient's only complaint at this time is hunger. Abdomen is benign vital signs are reassuring blood work is reassuring awaiting CT results given her age  ----------------------------------------- 12:41 PM on 01/07/2016 -----------------------------------------  She continued not to have any foot pain or evidence of DVT, compartment syndrome or any other acute pathology of the lower extremity including no evidence of joint infection, or gout. However, she has been strongly advised to follow-up with her PCP. She was able to take by mouth without any difficulty and serial abdominal exams were benign. Did make her worse all a CT findings and the need to follow-up with her primary care doctor and possibly GI for the nonspecific findings on her CT scan.  ----------------------------------------- 12:50 PM on 01/07/2016 -----------------------------------------  Discussed with her primary care, provider Dr Sandi Carne, who states  her last colonoscopy was 5 or 6 years ago. They will send her to GI for follow-up of the CT findings of narrowing of the colon. They will also follow up on her chronic leg pain. ____________________________________________   FINAL CLINICAL IMPRESSION(S) / ED DIAGNOSES  Final diagnoses:  None      This chart was dictated using voice recognition software.  Despite best efforts to proofread,  errors can occur which can change meaning.     Schuyler Amor, MD 01/07/16 [id number]  Schuyler Amor,  MD 01/07/16 1134  Schuyler Amor, MD 01/07/16 1204  Schuyler Amor, MD 01/07/16 [place], MD 01/07/16 [place], MD 01/07/16 1250

## 2016-01-07 NOTE — Discharge Instructions (Signed)
It is unclear why you have pain in your feet, please follow-up closely with your doctor for further evaluation of that. If you have increased pain in your stomach,  Fever,  you pass any blood or you feel worse in any way please return to the emergency department.

## 2016-09-18 ENCOUNTER — Emergency Department: Payer: Medicare HMO

## 2016-09-18 ENCOUNTER — Encounter: Payer: Self-pay | Admitting: Emergency Medicine

## 2016-09-18 ENCOUNTER — Emergency Department
Admission: EM | Admit: 2016-09-18 | Discharge: 2016-09-19 | Disposition: A | Discharge status: Home / Self Care | Payer: Medicare HMO | Attending: Emergency Medicine | Admitting: Emergency Medicine

## 2016-09-18 DIAGNOSIS — M546 Pain in thoracic spine: Secondary | ICD-10-CM | POA: Diagnosis not present

## 2016-09-18 DIAGNOSIS — I10 Essential (primary) hypertension: Secondary | ICD-10-CM | POA: Insufficient documentation

## 2016-09-18 DIAGNOSIS — F17200 Nicotine dependence, unspecified, uncomplicated: Secondary | ICD-10-CM | POA: Insufficient documentation

## 2016-09-18 DIAGNOSIS — Z79899 Other long term (current) drug therapy: Secondary | ICD-10-CM | POA: Insufficient documentation

## 2016-09-18 LAB — URINALYSIS COMPLETE WITH MICROSCOPIC (ARMC ONLY)
Bacteria, UA: NONE SEEN
SPECIFIC GRAVITY, URINE: 1.008 (ref 1.005–1.030)
SQUAMOUS EPITHELIAL / LPF: NONE SEEN
WBC, UA: NONE SEEN WBC/hpf (ref 0–5)

## 2016-09-18 LAB — COMPREHENSIVE METABOLIC PANEL
ALT: 19 U/L (ref 14–54)
AST: 25 U/L (ref 15–41)
Albumin: 4.5 g/dL (ref 3.5–5.0)
Alkaline Phosphatase: 70 U/L (ref 38–126)
Anion gap: 7 (ref 5–15)
BILIRUBIN TOTAL: 0.3 mg/dL (ref 0.3–1.2)
BUN: 26 mg/dL — AB (ref 6–20)
CHLORIDE: 102 mmol/L (ref 101–111)
CO2: 28 mmol/L (ref 22–32)
CREATININE: 1.09 mg/dL — AB (ref 0.44–1.00)
Calcium: 9.8 mg/dL (ref 8.9–10.3)
GFR calc Af Amer: 57 mL/min — ABNORMAL LOW (ref >60)
GFR, EST NON AFRICAN AMERICAN: 49 mL/min — AB (ref >60)
Glucose, Bld: 94 mg/dL (ref 65–99)
Potassium: 3.8 mmol/L (ref 3.5–5.1)
Sodium: 137 mmol/L (ref 135–145)
Total Protein: 8.6 g/dL — ABNORMAL HIGH (ref 6.5–8.1)

## 2016-09-18 LAB — CBC
HCT: 38.5 % (ref 35.0–47.0)
Hemoglobin: 12.6 g/dL (ref 12.0–16.0)
MCH: 28 pg (ref 26.0–34.0)
MCHC: 32.6 g/dL (ref 32.0–36.0)
MCV: 85.7 fL (ref 80.0–100.0)
PLATELETS: 263 10*3/uL (ref 150–440)
RBC: 4.49 MIL/uL (ref 3.80–5.20)
RDW: 14.4 % (ref 11.5–14.5)
WBC: 7.3 10*3/uL (ref 3.6–11.0)

## 2016-09-18 LAB — LIPASE, BLOOD: Lipase: 51 U/L (ref 11–51)

## 2016-09-18 LAB — TROPONIN I

## 2016-09-18 MED ORDER — LORAZEPAM 1 MG PO TABS
1.0000 mg | ORAL_TABLET | Freq: Once | ORAL | Status: AC
  Administered 2016-09-19: 1 mg via ORAL
  Filled 2016-09-18: qty 1

## 2016-09-18 MED ORDER — HYDROMORPHONE HCL 2 MG PO TABS
2.0000 mg | ORAL_TABLET | Freq: Once | ORAL | Status: AC
  Administered 2016-09-18: 2 mg via ORAL
  Filled 2016-09-18: qty 1

## 2016-09-18 NOTE — ED Notes (Signed)
Pt returned from Xray at this time  

## 2016-09-18 NOTE — ED Notes (Signed)
Pt taken for Xray

## 2016-09-18 NOTE — ED Notes (Signed)
Called MRI Tech who is already on the way here for another pt. MRI tech will let this RN know when machine is next available.

## 2016-09-18 NOTE — ED Triage Notes (Signed)
Patient reports pain in her upper abdomen radiating to her back and shoulder times three day. Patient reports nausea/ vomiting and shortness of breath.

## 2016-09-18 NOTE — ED Notes (Signed)
Pt up to BR. NAD noted at this time.

## 2016-09-18 NOTE — ED Provider Notes (Signed)
Time Seen: Approximately 2120  I have reviewed the triage notes  Chief Complaint: Abdominal Pain and Back Pain   History of Present Illness: Angela Yates is a 70 y.o. female who states that she's had some upper back pain and shoulder discomfort now for the last several days. Patient is extremely poor story and and to me denies any issues with abdominal pain, nausea, vomiting or shortness of breath. Pain is usually worse when she lies down flat. She denies any radiation of pain or weakness in the arms. She denies any diaphoresis or fever.   Past Medical History:  Diagnosis Date  . Hypertension     Patient Active Problem List   Diagnosis Date Noted  . Hypertension, essential 03/14/2015  . Vitamin D deficiency 03/14/2015  . 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 Formication 03/14/2015  . Poor historian 03/14/2015    History reviewed. No pertinent surgical history.  History reviewed. No pertinent surgical history.  Current Outpatient Rx  . Order #: [id number] Class: Historical Med  . Order #: [id number] Class: Historical Med  . Order #: [id number] Class: Historical Med  . Order #: [id number] Class: Historical Med  . Order #: [id number] Class: Print  . Order #: [id number] Class: Historical Med  . Order #: [id number] Class: Historical Med    Allergies:  Review of patient's allergies indicates no known allergies.  Family History: No family history on file.  Social History: Social History  Substance Use Topics  . Smoking status: Current Every Day Smoker  . Smokeless tobacco: Never Used  . Alcohol use Yes     Comment: occasionally     Review of Systems:   10 point review of systems was performed and was otherwise negative:  Constitutional: No fever Eyes: No visual disturbances ENT: No sore throat, ear pain Cardiac: No chest pain Respiratory: No shortness of breath, wheezing, or stridor Abdomen: No abdominal pain, no vomiting, No  diarrhea Endocrine: No weight loss, No night sweats Extremities: No peripheral edema, cyanosis Skin: No rashes, easy bruising Neurologic: No focal weakness, trouble with speech or swollowing Urologic: No dysuria, Hematuria, or urinary frequency Pain seems to be radiating from the back into the lower chest posteriorly wall is where she points.  Physical Exam:  ED Triage Vitals  Enc Vitals Group     BP 09/18/16 2016 139/81     Pulse Rate 09/18/16 2016 (!) 105     Resp 09/18/16 2016 18     Temp 09/18/16 2016 98.4 F (36.9 C)     Temp Source 09/18/16 2016 Oral     SpO2 09/18/16 2016 100 %     Weight 09/18/16 2017 166 lb (75.3 kg)     Height 09/18/16 2017 5\' 8"  (1.727 m)     Head Circumference --      Peak Flow --      Pain Score 09/18/16 2017 5     Pain Loc --      Pain Edu? --      Excl. in [place]? --     General: Awake , Alert , and Oriented times 3; GCS 15 Chronic formication Head: Normal cephalic , atraumatic Eyes: Pupils equal , round, reactive to light Nose/Throat: No nasal drainage, patent upper airway without erythema or exudate. Poor dentition Neck: Supple, Full range of motion, No anterior adenopathy or palpable thyroid masses Lungs: Clear to ascultation without wheezes , rhonchi, or rales Heart: Regular rate, regular rhythm without murmurs , gallops , or rubs Abdomen: Soft,  non tender without rebound, guarding , or rigidity; bowel sounds positive and symmetric in all 4 quadrants. No organomegaly .        Extremities: 2 plus symmetric pulses. No edema, clubbing or cyanosis Neurologic: normal ambulation, Motor symmetric without deficits, sensory intact Skin: warm, dry, no rashes Pain is somewhat reproducible with palpation over the upper thoracic spine.  Labs:   All laboratory work was reviewed including any pertinent negatives or positives listed below:  Labs Reviewed  COMPREHENSIVE METABOLIC PANEL - Abnormal; Notable for the following:       Result Value   BUN 26  (*)    Creatinine, Ser 1.09 (*)    Total Protein 8.6 (*)    GFR calc non Af Amer 49 (*)    GFR calc Af Amer 57 (*)    All other components within normal limits  URINALYSIS COMPLETEWITH MICROSCOPIC ([facility] ONLY) - Abnormal; Notable for the following:    Color, Urine ORANGE (*)    APPearance CLEAR (*)    Glucose, UA   (*)    Value: TEST NOT REPORTED DUE TO COLOR INTERFERENCE OF URINE PIGMENT   Bilirubin Urine   (*)    Value: TEST NOT REPORTED DUE TO COLOR INTERFERENCE OF URINE PIGMENT   Ketones, ur   (*)    Value: TEST NOT REPORTED DUE TO COLOR INTERFERENCE OF URINE PIGMENT   Hgb urine dipstick   (*)    Value: TEST NOT REPORTED DUE TO COLOR INTERFERENCE OF URINE PIGMENT   Protein, ur   (*)    Value: TEST NOT REPORTED DUE TO COLOR INTERFERENCE OF URINE PIGMENT   Nitrite   (*)    Value: TEST NOT REPORTED DUE TO COLOR INTERFERENCE OF URINE PIGMENT   Leukocytes, UA   (*)    Value: TEST NOT REPORTED DUE TO COLOR INTERFERENCE OF URINE PIGMENT   All other components within normal limits  LIPASE, BLOOD  CBC  TROPONIN I  Overall laboratory work within normal limits.  EKG:  ED ECG REPORT I, Daymon Larsen, the attending physician, personally viewed and interpreted this ECG.  Date: 09/18/2016 EKG Time: 2027 Rate: 94 Rhythm: normal sinus rhythm QRS Axis: normal Intervals: normal ST/T Wave abnormalities: normal Conduction Disturbances: none Narrative Interpretation: unremarkable Left ventricular hypertrophy   Radiology: "Dg Chest 2 View  Result Date: 09/18/2016 CLINICAL DATA:  70 year old female with chest pain. No known injury. EXAM: CHEST  2 VIEW COMPARISON:  Thoracic spine radiograph dated 09/18/2016 FINDINGS: The lungs are clear. There is no pleural effusion or pneumothorax. The cardiac silhouette is within normal limits. The aorta is somewhat tortuous. There is slight apparent notching of the inferior surfaces of the right lateral sixth and eighth ribs which may be related to  sequela of old fracture. Coarctation of the aorta is not excluded. No acute fracture identified. IMPRESSION: No acute cardiopulmonary process.  No definite acute fracture. Apparent notching of the right lateral sixth and eighth ribs. Coarctation of the aorta is not excluded. Clinical correlation is recommended. Electronically Signed   By: Anner Crete M.D.   On: 09/18/2016 22:47   Dg Thoracic Spine 2 View  Result Date: 09/18/2016 CLINICAL DATA:  Bilateral side and back pain for 3-4 days. No known injury. EXAM: THORACIC SPINE 2 VIEWS COMPARISON:  Lower thoracic spine from T11 caudad on reformats from a CT abdomen dated 01/07/16 FINDINGS: There is mild moderate superior endplate compression of T12 without retropulsion. There is mild anterior wedge deformity of T5,  T6 and T7 likely attributable to degenerative disc disease at T5-6 and T6-7. Subtle mild T10 superior endplate compression is also noted, age indeterminate. IMPRESSION: Approximately 50% height loss T2 involving superior endplate with are retropulsion. Minimal anterior wedge deformity of T5, T6 and T7 that likely attributable to degenerative disc disease with less than 10% height loss most marked affecting T6. Mild superior endplate compression of T10 also less than 10% height loss. These are all age indeterminate in the absence of prior studies. If clinically necessary, MRI would be the study of choice for further assessment. Electronically Signed   By: Ashley Royalty M.D.   On: 09/18/2016 22:44  " I personally reviewed the radiologic studies  Patient has an MRI pending for evaluation further of a compression injury at the T-2 level    ED Course: * Patient received Norco and Ativan prior to MRI evaluation. She has no contraindications for a thoracic MRI such as pacemaker mental problem next, etc. I felt a compression fracture is likely the source of her discomfort and cannot elicit any abdominal pain on history and she is very adamant that  she has no nausea vomiting or shortness of breath.  Clinical Course     Assessment: * Compression fracture at T2   Final Clinical Impression:   Final diagnoses:  Thoracic spine pain     Plan: * The patient will be checked out to my colleague to follow the results of her thoracic MRI. Physician will primarily be designed around the study.            Daymon Larsen, MD 09/18/16 [phone]

## 2016-09-18 NOTE — ED Notes (Signed)
Pt registered and went to BR to void; given wipes and collection cup; verbalized understanding of proper collection

## 2016-09-19 ENCOUNTER — Emergency Department: Payer: Medicare HMO

## 2016-09-19 MED ORDER — ETODOLAC 200 MG PO CAPS: 200.0000 mg | ORAL_CAPSULE | Freq: Three times a day (TID) | ORAL | 0 refills | Status: DC

## 2016-09-19 NOTE — ED Notes (Signed)
Pt to MRI

## 2016-09-19 NOTE — Discharge Instructions (Signed)
Please follow up with your primary care physician.

## 2016-09-19 NOTE — ED Provider Notes (Signed)
-----------------------------------------   2:08 AM on 09/19/2016 -----------------------------------------   Blood pressure 109/71, pulse 77, temperature 98.4 F (36.9 C), temperature source Oral, resp. rate 13, height 5\' 8"  (1.727 m), weight 166 lb (75.3 kg), SpO2 99 %.  Assuming care from Dr. Marcelene Butte.  In short, Angela Yates is a 70 y.o. female with a chief complaint of Abdominal Pain and Back Pain .  Refer to the original H&P for additional details.  The current plan of care is to follow up the results of the MRI.   MRI thoracic spine:  No fracture or malalignment ; radiograph finding was artifact.    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 No  canal stenosis or neural foraminal narrowing at any level.   The patient will be discharged to home   Loney Hering, MD 09/19/16 [phone]

## 2016-09-19 NOTE — ED Notes (Signed)
Patient transported to MRI 

## 2016-09-19 NOTE — ED Notes (Signed)
Pt left sub wait without informing this RN. Pt had not signed e-signature pad before leaving. However, pt did leave at time requested.

## 2016-09-19 NOTE — ED Notes (Signed)
Pt to sub wait at this time. Pt informed that she cannot drive herself home until 4+hours after narcotics given. [employer] made aware of situation. Pt will wait until she is medically safe to drive home.

## 2016-11-01 ENCOUNTER — Other Ambulatory Visit: Payer: Self-pay | Admitting: Family Medicine

## 2016-11-01 DIAGNOSIS — Z1239 Encounter for other screening for malignant neoplasm of breast: Secondary | ICD-10-CM

## 2016-12-17 ENCOUNTER — Ambulatory Visit: Payer: Medicare HMO | Attending: Family Medicine

## 2017-02-01 ENCOUNTER — Emergency Department
Admission: EM | Admit: 2017-02-01 | Discharge: 2017-02-01 | Disposition: A | Discharge status: Home / Self Care | Payer: Medicare HMO | Attending: Emergency Medicine | Admitting: Emergency Medicine

## 2017-02-01 ENCOUNTER — Encounter: Payer: Self-pay | Admitting: *Deleted

## 2017-02-01 ENCOUNTER — Emergency Department: Payer: Medicare HMO

## 2017-02-01 DIAGNOSIS — R1032 Left lower quadrant pain: Secondary | ICD-10-CM | POA: Diagnosis not present

## 2017-02-01 DIAGNOSIS — Z79899 Other long term (current) drug therapy: Secondary | ICD-10-CM | POA: Insufficient documentation

## 2017-02-01 DIAGNOSIS — M79672 Pain in left foot: Secondary | ICD-10-CM | POA: Diagnosis not present

## 2017-02-01 DIAGNOSIS — Z791 Long term (current) use of non-steroidal anti-inflammatories (NSAID): Secondary | ICD-10-CM | POA: Diagnosis not present

## 2017-02-01 DIAGNOSIS — F17210 Nicotine dependence, cigarettes, uncomplicated: Secondary | ICD-10-CM | POA: Diagnosis not present

## 2017-02-01 DIAGNOSIS — I10 Essential (primary) hypertension: Secondary | ICD-10-CM | POA: Diagnosis not present

## 2017-02-01 LAB — LIPASE, BLOOD: Lipase: 38 U/L (ref 11–51)

## 2017-02-01 LAB — URINALYSIS, COMPLETE (UACMP) WITH MICROSCOPIC
BACTERIA UA: NONE SEEN
BILIRUBIN URINE: NEGATIVE
Glucose, UA: NEGATIVE mg/dL
KETONES UR: NEGATIVE mg/dL
NITRITE: NEGATIVE
PH: 5 (ref 5.0–8.0)
Protein, ur: NEGATIVE mg/dL
Specific Gravity, Urine: 1.003 — ABNORMAL LOW (ref 1.005–1.030)

## 2017-02-01 LAB — COMPREHENSIVE METABOLIC PANEL
ALT: 20 U/L (ref 14–54)
AST: 30 U/L (ref 15–41)
Albumin: 4.5 g/dL (ref 3.5–5.0)
Alkaline Phosphatase: 69 U/L (ref 38–126)
Anion gap: 9 (ref 5–15)
BILIRUBIN TOTAL: 0.6 mg/dL (ref 0.3–1.2)
BUN: 14 mg/dL (ref 6–20)
CO2: 26 mmol/L (ref 22–32)
Calcium: 9.7 mg/dL (ref 8.9–10.3)
Chloride: 101 mmol/L (ref 101–111)
Creatinine, Ser: 0.9 mg/dL (ref 0.44–1.00)
Glucose, Bld: 100 mg/dL — ABNORMAL HIGH (ref 65–99)
POTASSIUM: 3.9 mmol/L (ref 3.5–5.1)
Sodium: 136 mmol/L (ref 135–145)
Total Protein: 8.8 g/dL — ABNORMAL HIGH (ref 6.5–8.1)

## 2017-02-01 LAB — CBC
HEMATOCRIT: 34.9 % — AB (ref 35.0–47.0)
Hemoglobin: 11.9 g/dL — ABNORMAL LOW (ref 12.0–16.0)
MCH: 29.3 pg (ref 26.0–34.0)
MCHC: 34.1 g/dL (ref 32.0–36.0)
MCV: 86 fL (ref 80.0–100.0)
Platelets: 301 10*3/uL (ref 150–440)
RBC: 4.06 MIL/uL (ref 3.80–5.20)
RDW: 13.4 % (ref 11.5–14.5)
WBC: 7.3 10*3/uL (ref 3.6–11.0)

## 2017-02-01 MED ORDER — IOPAMIDOL (ISOVUE-300) INJECTION 61%
30.0000 mL | Freq: Once | INTRAVENOUS | Status: AC | PRN
  Administered 2017-02-01: 30 mL via ORAL

## 2017-02-01 MED ORDER — IOPAMIDOL (ISOVUE-300) INJECTION 61%
100.0000 mL | Freq: Once | INTRAVENOUS | Status: AC | PRN
  Administered 2017-02-01: 100 mL via INTRAVENOUS

## 2017-02-01 MED ORDER — FENTANYL CITRATE (PF) 100 MCG/2ML IJ SOLN
50.0000 ug | Freq: Once | INTRAMUSCULAR | Status: AC
  Administered 2017-02-01: 50 ug via INTRAVENOUS
  Filled 2017-02-01: qty 2

## 2017-02-01 MED ORDER — SODIUM CHLORIDE 0.9 % IV BOLUS (SEPSIS)
1000.0000 mL | Freq: Once | INTRAVENOUS | Status: AC
  Administered 2017-02-01: 1000 mL via INTRAVENOUS

## 2017-02-01 NOTE — ED Notes (Signed)
Pt to CT

## 2017-02-01 NOTE — ED Notes (Signed)
Pt up to bathroom in room, no difficulty ambulating.

## 2017-02-01 NOTE — Discharge Instructions (Signed)
You may continue to take Tylenol for your abdominal pain in your foot pain. Please return to the emergency department if you develop severe pain, fever, inability to keep down fluids, or any other symptoms concerning to you.

## 2017-02-01 NOTE — ED Notes (Signed)
Patient assisted to room commode. 

## 2017-02-01 NOTE — ED Provider Notes (Signed)
[facility] Emergency Department Provider Note  ____________________________________________  Time seen: Approximately 8:34 AM  I have reviewed the triage vital signs and the nursing notes.   HISTORY  Chief Complaint Abdominal Pain and Foot Pain    HPI Angela Yates is a 71 y.o. female who lives alone with a history of HTN, and is documented to be a poor historian, presenting with abdominal pain and left foot pain. The patient reports that this morning she woke up and noted a pain that she cannot characterize in the suprapubic area and the left lower quadrant. She has had no associated nausea or vomiting and her last bowel movement was this morning which was normal. She has no fever or chills, no dysuria, or urinary frequency. In addition, she noted that she had pain "all over" her left foot with no specific trauma although she states "I'm off a lot semi-to bumped it." She took a Tylenol and the pain went away.  SH: States she intermittently lives with her children and intermittently lives at home; at this time she has been living by herself for the last week.   Past Medical History:  Diagnosis Date  . Hypertension     Patient Active Problem List   Diagnosis Date Noted  . Hypertension, essential 03/14/2015  . Vitamin D deficiency 03/14/2015  . 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 Formication 03/14/2015  . Poor historian 03/14/2015    History reviewed. No pertinent surgical history.  Current Outpatient Rx  . Order #: [id number] Class: Historical Med  . Order #: [id number] Class: Historical Med  . Order #: [id number] Class: Historical Med  . Order #: [id number] Class: Print  . Order #: [id number] Class: Historical Med  . Order #: [id number] Class: Historical Med  . Order #: [id number] Class: Historical Med  . Order #: [id number] Class: Historical Med  . Order #: [id number] Class: Historical Med  . Order #: [id number] Class:  Historical Med  . Order #: [id number] Class: Historical Med  . Order #: [id number] Class: Historical Med  . Order #: [id number] Class: Print    Allergies Patient has no known allergies.  No family history on file.  Social History Social History  Substance Use Topics  . Smoking status: Current Every Day Smoker    Packs/day: 0.50    Types: Cigarettes  . Smokeless tobacco: Never Used  . Alcohol use Yes     Comment: occasionally    Review of Systems Constitutional: No fever/chills. Eyes: No visual changes. ENT: No sore throat. No congestion or rhinorrhea. Cardiovascular: Denies chest pain. Denies palpitations. Respiratory: Denies shortness of breath.  No cough. Gastrointestinal: Positive suprapubic and left lower quadrant abdominal pain.  No nausea, no vomiting.  No diarrhea.  No constipation. Genitourinary: Negative for dysuria. Urinary frequency. Musculoskeletal: Negative for back pain. Positive left foot pain, now resolved. Skin: Negative for rash. Neurological: Negative for headaches. No focal numbness, tingling or weakness.   10-point ROS otherwise negative.  ____________________________________________   PHYSICAL EXAM:  VITAL SIGNS: ED Triage Vitals  Enc Vitals Group     BP 02/01/17 0740 (!) 150/79     Pulse Rate 02/01/17 0740 (!) 117     Resp 02/01/17 0740 20     Temp 02/01/17 0740 98.6 F (37 C)     Temp Source 02/01/17 0740 Oral     SpO2 02/01/17 0740 100 %     Weight 02/01/17 0741 165 lb (74.8 kg)     Height 02/01/17 0741 5\' 8"  (1.727 m)  Head Circumference --      Peak Flow --      Pain Score 02/01/17 0741 5     Pain Loc --      Pain Edu? --      Excl. in [place]? --     Constitutional: Alert and oriented. Well appearing and in no acute distress. Answers questions appropriatelyAlthough is circuitous at times and poor historian.. Eyes: Conjunctivae are normal.  EOMI. No scleral icterus. Head: Atraumatic. Nose: No congestion/rhinnorhea. Mouth/Throat:  Mucous membranes are moist.  Neck: No stridor.  Supple.   Cardiovascular: Normal rate, regular rhythm. No murmurs, rubs or gallops.  Respiratory: Normal respiratory effort.  No accessory muscle use or retractions. Lungs CTAB.  No wheezes, rales or ronchi. Gastrointestinal: Soft, and nondistended.  Mildly tender in the suprapubic less than left lower quadrant region. No guarding or rebound.  No peritoneal signs. Musculoskeletal: No LE edema. No ttp in the calves or palpable cords.  Negative Homan's sign. No visual abnormalities to the left foot, full range of motion of the left ankle and knee without pain, no pain with palpation over the mid foot. Normal DP and PT pulse on the left foot. Normal sensation to light touch throughout the left foot. The skin overlying the foot is normal. Neurologic:  Alert.  Speech is clear.  Face and smile are symmetric.  EOMI.  Moves all extremities well. Skin:  Skin is warm, dry and intact. No rash noted. Psychiatric: Mood and affect are normal.  ____________________________________________   LABS (all labs ordered are listed, but only abnormal results are displayed)  Labs Reviewed  COMPREHENSIVE METABOLIC PANEL - Abnormal; Notable for the following:       Result Value   Glucose, Bld 100 (*)    Total Protein 8.8 (*)    All other components within normal limits  CBC - Abnormal; Notable for the following:    Hemoglobin 11.9 (*)    HCT 34.9 (*)    All other components within normal limits  URINALYSIS, COMPLETE (UACMP) WITH MICROSCOPIC - Abnormal; Notable for the following:    Color, Urine YELLOW (*)    APPearance CLEAR (*)    Specific Gravity, Urine 1.003 (*)    Hgb urine dipstick MODERATE (*)    Leukocytes, UA TRACE (*)    Squamous Epithelial / LPF 0-5 (*)    All other components within normal limits  LIPASE, BLOOD   ____________________________________________  EKG  Not indicated ____________________________________________  RADIOLOGY  Ct  Abdomen Pelvis W Contrast  Result Date: 02/01/2017 CLINICAL DATA:  Lower abdominal pain. EXAM: CT ABDOMEN AND PELVIS WITH CONTRAST TECHNIQUE: Multidetector CT imaging of the abdomen and pelvis was performed using the standard protocol following bolus administration of intravenous contrast. CONTRAST:  160mL ISOVUE-300 IOPAMIDOL (ISOVUE-300) INJECTION 61% COMPARISON:  01/07/2016 FINDINGS: Lower chest: Subsegmental atelectasis noted in the lingula and right lower lobe Hepatobiliary: No focal abnormality within the liver parenchyma. There is no evidence for gallstones, gallbladder wall thickening, or pericholecystic fluid. No intrahepatic or extrahepatic biliary dilation. Pancreas: No focal mass lesion. No dilatation of the main duct. No intraparenchymal cyst. No peripancreatic edema. Spleen: No splenomegaly. No focal mass lesion. Adrenals/Urinary Tract: No adrenal nodule or mass. Stable appearance of small cysts in the kidneys bilaterally. No hydronephrosis. Duplicated left intrarenal collecting system noted. No evidence for hydroureter. The urinary bladder appears normal for the degree of distention. Stomach/Bowel: Probable tiny hiatal hernia. Stomach otherwise unremarkable. Duodenum is normally positioned as is the ligament of Treitz.  No small bowel wall thickening. No small bowel dilatation. The terminal ileum is normal. The appendix is normal. No gross colonic mass. No colonic wall thickening. No substantial diverticular change. Vascular/Lymphatic: There is abdominal aortic atherosclerosis without aneurysm. There is no gastrohepatic or hepatoduodenal ligament lymphadenopathy. No intraperitoneal or retroperitoneal lymphadenopathy. No pelvic sidewall lymphadenopathy. Reproductive: Uterus unremarkable.  There is no adnexal mass. Other: No intraperitoneal free fluid. Musculoskeletal: Bone windows reveal no worrisome lytic or sclerotic osseous lesions. 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 IMPRESSION: 1.  No acute findings in the abdomen or pelvis. Specifically, no findings to explain the patient's history of pain. 2.  Abdominal Aortic Atherosclerois (ICD10-170.0) Electronically Signed   By: Misty Stanley M.D.   On: 02/01/2017 10:47    ____________________________________________   PROCEDURES  Procedure(s) performed: None  Procedures  Critical Care performed: No ____________________________________________   INITIAL IMPRESSION / ASSESSMENT AND PLAN / ED COURSE  Pertinent labs & imaging results that were available during my care of the patient were reviewed by me and considered in my medical decision making (see chart for details).  71 y.o. female with a history of HTN presenting with left lower quadrant and suprapubic pain, as well as resolved left foot pain. At this time no further evaluation is needed for the left foot. The patient's urinalysis is not consistent with UTI, and given that the pain is more in the left lower quadrant, although CT abdomen to evaluate for possible diverticulitis. Plan reevaluation for final disposition.  ----------------------------------------- 1:36 PM on 02/01/2017 -----------------------------------------  The patient's workup in the emergency department is reassuring. She has continued to be hemodynamically stable and her CT scan does not show any acute process. Plan discharge at this time. She understood to return precautions as well as follow-up instructions.    ____________________________________________  FINAL CLINICAL IMPRESSION(S) / ED DIAGNOSES  Final diagnoses:  Left lower quadrant pain  Left foot pain         NEW MEDICATIONS STARTED DURING THIS VISIT:  New Prescriptions   No medications on file      Eula Listen, MD 02/01/17 1338

## 2017-02-01 NOTE — ED Triage Notes (Signed)
Pt complains of lower abdominal pain below the umbilicus, pt denies nausea and vomiting, pt complains of left foot pain

## 2017-02-10 ENCOUNTER — Encounter: Payer: Self-pay | Admitting: Emergency Medicine

## 2017-02-10 DIAGNOSIS — Z79899 Other long term (current) drug therapy: Secondary | ICD-10-CM | POA: Insufficient documentation

## 2017-02-10 DIAGNOSIS — I10 Essential (primary) hypertension: Secondary | ICD-10-CM | POA: Diagnosis not present

## 2017-02-10 DIAGNOSIS — F17210 Nicotine dependence, cigarettes, uncomplicated: Secondary | ICD-10-CM | POA: Diagnosis not present

## 2017-02-10 DIAGNOSIS — R935 Abnormal findings on diagnostic imaging of other abdominal regions, including retroperitoneum: Secondary | ICD-10-CM | POA: Insufficient documentation

## 2017-02-10 DIAGNOSIS — Z791 Long term (current) use of non-steroidal anti-inflammatories (NSAID): Secondary | ICD-10-CM | POA: Insufficient documentation

## 2017-02-10 DIAGNOSIS — G8929 Other chronic pain: Secondary | ICD-10-CM | POA: Insufficient documentation

## 2017-02-10 DIAGNOSIS — N12 Tubulo-interstitial nephritis, not specified as acute or chronic: Secondary | ICD-10-CM | POA: Diagnosis not present

## 2017-02-10 DIAGNOSIS — M545 Low back pain: Secondary | ICD-10-CM | POA: Diagnosis present

## 2017-02-10 NOTE — ED Triage Notes (Addendum)
Pt ambulatory to triage in NAD, reports back pain, from upper to lower, radiating down left leg, reports it's a crawling feeling.  Pt reports dysuria as well.

## 2017-02-11 ENCOUNTER — Emergency Department
Admission: EM | Admit: 2017-02-11 | Discharge: 2017-02-11 | Disposition: A | Discharge status: Home / Self Care | Payer: Medicare HMO | Attending: Emergency Medicine | Admitting: Emergency Medicine

## 2017-02-11 ENCOUNTER — Emergency Department: Payer: Medicare HMO

## 2017-02-11 DIAGNOSIS — M545 Low back pain: Secondary | ICD-10-CM

## 2017-02-11 DIAGNOSIS — N12 Tubulo-interstitial nephritis, not specified as acute or chronic: Secondary | ICD-10-CM

## 2017-02-11 DIAGNOSIS — G8929 Other chronic pain: Secondary | ICD-10-CM

## 2017-02-11 LAB — URINALYSIS, COMPLETE (UACMP) WITH MICROSCOPIC
BILIRUBIN URINE: NEGATIVE
Glucose, UA: NEGATIVE mg/dL
Ketones, ur: NEGATIVE mg/dL
Nitrite: NEGATIVE
PH: 5 (ref 5.0–8.0)
Protein, ur: NEGATIVE mg/dL
SPECIFIC GRAVITY, URINE: 1.008 (ref 1.005–1.030)

## 2017-02-11 LAB — BASIC METABOLIC PANEL
Anion gap: 8 (ref 5–15)
BUN: 24 mg/dL — AB (ref 6–20)
CALCIUM: 9.2 mg/dL (ref 8.9–10.3)
CO2: 25 mmol/L (ref 22–32)
Chloride: 102 mmol/L (ref 101–111)
Creatinine, Ser: 1.08 mg/dL — ABNORMAL HIGH (ref 0.44–1.00)
GFR calc Af Amer: 58 mL/min — ABNORMAL LOW (ref >60)
GFR, EST NON AFRICAN AMERICAN: 50 mL/min — AB (ref >60)
GLUCOSE: 109 mg/dL — AB (ref 65–99)
Potassium: 3.6 mmol/L (ref 3.5–5.1)
Sodium: 135 mmol/L (ref 135–145)

## 2017-02-11 LAB — CBC
HCT: 35.2 % (ref 35.0–47.0)
Hemoglobin: 11.8 g/dL — ABNORMAL LOW (ref 12.0–16.0)
MCH: 29.2 pg (ref 26.0–34.0)
MCHC: 33.7 g/dL (ref 32.0–36.0)
MCV: 86.8 fL (ref 80.0–100.0)
PLATELETS: 290 10*3/uL (ref 150–440)
RBC: 4.05 MIL/uL (ref 3.80–5.20)
RDW: 13.4 % (ref 11.5–14.5)
WBC: 7.7 10*3/uL (ref 3.6–11.0)

## 2017-02-11 LAB — CK: Total CK: 107 U/L (ref 38–234)

## 2017-02-11 MED ORDER — IBUPROFEN 600 MG PO TABS
600.0000 mg | ORAL_TABLET | Freq: Once | ORAL | Status: AC
  Administered 2017-02-11: 600 mg via ORAL
  Filled 2017-02-11: qty 1

## 2017-02-11 MED ORDER — CEPHALEXIN 500 MG PO CAPS
500.0000 mg | ORAL_CAPSULE | Freq: Once | ORAL | Status: AC
  Administered 2017-02-11: 500 mg via ORAL
  Filled 2017-02-11: qty 1

## 2017-02-11 MED ORDER — CEPHALEXIN 500 MG PO CAPS: 500.0000 mg | ORAL_CAPSULE | Freq: Four times a day (QID) | ORAL | 0 refills | Status: AC

## 2017-02-11 MED ORDER — ACETAMINOPHEN 500 MG PO TABS
1000.0000 mg | ORAL_TABLET | Freq: Once | ORAL | Status: AC
  Administered 2017-02-11: 1000 mg via ORAL
  Filled 2017-02-11: qty 2

## 2017-02-11 NOTE — Discharge Instructions (Signed)
Please take all of your antibiotics as prescribed. Return to the emergency department for any new or worsening symptoms such as if your pain worsens, if she cannot eat or drink, or for any other concerns.  It was a pleasure to take care of you today, and thank you for coming to our emergency department.  If you have any questions or concerns before leaving please ask the nurse to grab me and I'm more than happy to go through your aftercare instructions again.  If you were prescribed any opioid pain medication today such as Norco, Vicodin, Percocet, morphine, hydrocodone, or oxycodone please make sure you do not drive when you are taking this medication as it can alter your ability to drive safely.  If you have any concerns once you are home that you are not improving or are in fact getting worse before you can make it to your follow-up appointment, please do not hesitate to call 911 and come back for further evaluation.  Darel Hong MD  Results for orders placed or performed during the hospital encounter of 02/11/17  Urinalysis, Complete w Microscopic  Result Value Ref Range   Color, Urine YELLOW (A) YELLOW   APPearance HAZY (A) CLEAR   Specific Gravity, Urine 1.008 1.005 - 1.030   pH 5.0 5.0 - 8.0   Glucose, UA NEGATIVE NEGATIVE mg/dL   Hgb urine dipstick MODERATE (A) NEGATIVE   Bilirubin Urine NEGATIVE NEGATIVE   Ketones, ur NEGATIVE NEGATIVE mg/dL   Protein, ur NEGATIVE NEGATIVE mg/dL   Nitrite NEGATIVE NEGATIVE   Leukocytes, UA LARGE (A) NEGATIVE   RBC / HPF 0-5 0 - 5 RBC/hpf   WBC, UA 6-30 0 - 5 WBC/hpf   Bacteria, UA RARE (A) NONE SEEN   Squamous Epithelial / LPF 0-5 (A) NONE SEEN   Mucous PRESENT    Hyaline Casts, UA PRESENT   CBC  Result Value Ref Range   WBC 7.7 3.6 - 11.0 K/uL   RBC 4.05 3.80 - 5.20 MIL/uL   Hemoglobin 11.8 (L) 12.0 - 16.0 g/dL   HCT 35.2 35.0 - 47.0 %   MCV 86.8 80.0 - 100.0 fL   MCH 29.2 26.0 - 34.0 pg   MCHC 33.7 32.0 - 36.0 g/dL   RDW 13.4 11.5  - 14.5 %   Platelets 290 150 - 440 K/uL  Basic metabolic panel  Result Value Ref Range   Sodium 135 135 - 145 mmol/L   Potassium 3.6 3.5 - 5.1 mmol/L   Chloride 102 101 - 111 mmol/L   CO2 25 22 - 32 mmol/L   Glucose, Bld 109 (H) 65 - 99 mg/dL   BUN 24 (H) 6 - 20 mg/dL   Creatinine, Ser 1.08 (H) 0.44 - 1.00 mg/dL   Calcium 9.2 8.9 - 10.3 mg/dL   GFR calc non Af Amer 50 (L) >60 mL/min   GFR calc Af Amer 58 (L) >60 mL/min   Anion gap 8 5 - 15  CK  Result Value Ref Range   Total CK 107 38 - 234 U/L   Ct Abdomen Pelvis W Contrast  Result Date: 02/01/2017 CLINICAL DATA:  Lower abdominal pain. EXAM: CT ABDOMEN AND PELVIS WITH CONTRAST TECHNIQUE: Multidetector CT imaging of the abdomen and pelvis was performed using the standard protocol following bolus administration of intravenous contrast. CONTRAST:  173mL ISOVUE-300 IOPAMIDOL (ISOVUE-300) INJECTION 61% COMPARISON:  01/07/2016 FINDINGS: Lower chest: Subsegmental atelectasis noted in the lingula and right lower lobe Hepatobiliary: No focal abnormality within the  liver parenchyma. There is no evidence for gallstones, gallbladder wall thickening, or pericholecystic fluid. No intrahepatic or extrahepatic biliary dilation. Pancreas: No focal mass lesion. No dilatation of the main duct. No intraparenchymal cyst. No peripancreatic edema. Spleen: No splenomegaly. No focal mass lesion. Adrenals/Urinary Tract: No adrenal nodule or mass. Stable appearance of small cysts in the kidneys bilaterally. No hydronephrosis. Duplicated left intrarenal collecting system noted. No evidence for hydroureter. The urinary bladder appears normal for the degree of distention. Stomach/Bowel: Probable tiny hiatal hernia. Stomach otherwise unremarkable. Duodenum is normally positioned as is the ligament of Treitz. No small bowel wall thickening. No small bowel dilatation. The terminal ileum is normal. The appendix is normal. No gross colonic mass. No colonic wall thickening. No  substantial diverticular change. Vascular/Lymphatic: There is abdominal aortic atherosclerosis without aneurysm. There is no gastrohepatic or hepatoduodenal ligament lymphadenopathy. No intraperitoneal or retroperitoneal lymphadenopathy. No pelvic sidewall lymphadenopathy. Reproductive: Uterus unremarkable.  There is no adnexal mass. Other: No intraperitoneal free fluid. Musculoskeletal: Bone windows reveal no worrisome lytic or sclerotic osseous lesions. 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 IMPRESSION: 1. No acute findings in the abdomen or pelvis. Specifically, no findings to explain the patient's history of pain. 2.  Abdominal Aortic Atherosclerois (ICD10-170.0) Electronically Signed   By: Misty Stanley M.D.   On: 02/01/2017 10:47   Ct Renal Stone Study  Result Date: 02/11/2017 CLINICAL DATA:  Hematuria and flank pain radiating down the left leg. Dysuria. EXAM: CT ABDOMEN AND PELVIS WITHOUT CONTRAST TECHNIQUE: Multidetector CT imaging of the abdomen and pelvis was performed following the standard protocol without IV contrast. COMPARISON:  02/01/2017 FINDINGS: Lower chest: Lung bases are clear. Hepatobiliary: No focal liver abnormality is seen. No gallstones, gallbladder wall thickening, or biliary dilatation. Pancreas: Unremarkable. No pancreatic ductal dilatation or surrounding inflammatory changes. Spleen: Normal in size without focal abnormality. Adrenals/Urinary Tract: No adrenal gland nodules. Kidneys are symmetrical in size. No hydronephrosis or hydroureter. Bladder wall is mildly thickened which may indicate cystitis. No filling defects or bladder stones identified. Calcified phleboliths along the gonadal veins. Stomach/Bowel: Stomach and small bowel are decompressed. Diffusely stool-filled colon. No inflammatory infiltration around the bowel. Appendix is normal. Vascular/Lymphatic: Aortic atherosclerosis. No enlarged abdominal or pelvic lymph nodes. Reproductive: Broad-based ventral  abdominal wall hernia containing some transverse colon and small bowel but without evidence of obstruction. Other: No abdominal wall hernia or abnormality. No abdominopelvic ascites. Musculoskeletal: Degenerative changes in the spine. No destructive bone lesions. IMPRESSION: No renal or ureteral stone or obstruction. Mild diffuse bladder wall thickening may indicate cystitis. Electronically Signed   By: Lucienne Capers M.D.   On: 02/11/2017 03:29

## 2017-02-11 NOTE — ED Provider Notes (Signed)
[facility] Emergency Department Provider Note  ____________________________________________   First MD Initiated Contact with Patient 02/11/17 0254     (approximate)  I have reviewed the triage vital signs and the nursing notes.   HISTORY  Chief Complaint Back Pain    HPI Angela Yates is a 71 y.o. female who comes to the emergency department with 1 day of mild to moderate aching low back pain and dysuria. Pain is worse when urinating. No fevers or chills. She does have hesitancy. No numbness or weakness. Pain does not radiate down her legs. No abdominal pain. Some nausea but no vomiting. No history of abdominal surgeries.   Past Medical History:  Diagnosis Date  . Hypertension     Patient Active Problem List   Diagnosis Date Noted  . Hypertension, essential 03/14/2015  . Vitamin D deficiency 03/14/2015  . 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 Formication 03/14/2015  . Poor historian 03/14/2015    History reviewed. No pertinent surgical history.  Prior to Admission medications   Medication Sig Start Date End Date Taking? Authorizing Provider  acetaminophen (TYLENOL) 500 MG tablet Take 500 mg by mouth every 6 (six) hours as needed.    Historical Provider, MD  amLODipine (NORVASC) 5 MG tablet Take 1 tablet by mouth daily. 11/05/15   Historical Provider, MD  cephALEXin (KEFLEX) 500 MG capsule Take 1 capsule (500 mg total) by mouth 4 (four) times daily. 02/11/17 02/21/17  Darel Hong, MD  cholecalciferol (VITAMIN D) 1000 UNITS tablet Take 2,000 Units by mouth daily.    Historical Provider, MD  etodolac (LODINE) 200 MG capsule Take 1 capsule (200 mg total) by mouth every 8 (eight) hours. 09/19/16   Loney Hering, MD  fluticasone (FLONASE) 50 MCG/ACT nasal spray Place 1 spray into both nostrils daily. 01/12/17   Historical Provider, MD  hydrochlorothiazide (HYDRODIURIL) 25 MG tablet Take 25 mg by mouth  daily.    Historical Provider, MD  ibuprofen (ADVIL,MOTRIN) 200 MG tablet Take 200 mg by mouth every 6 (six) hours as needed.    Historical Provider, MD  lisinopril (PRINIVIL,ZESTRIL) 10 MG tablet Take 1 tablet by mouth daily. 12/26/15   Historical Provider, MD  omeprazole (PRILOSEC) 20 MG capsule Take 20 mg by mouth daily.    Historical Provider, MD  ondansetron (ZOFRAN) 4 MG tablet Take 1 tablet (4 mg total) by mouth daily as needed for nausea or vomiting. Patient not taking: Reported on 02/01/2017 01/07/16   Schuyler Amor, MD  potassium chloride (MICRO-K) 10 MEQ CR capsule Take 1 capsule by mouth daily.  12/29/15   Historical Provider, MD  TRAVATAN Z 0.004 % SOLN ophthalmic solution Place 1 drop into both eyes daily. 12/29/15   Historical Provider, MD  vitamin C (ASCORBIC ACID) 500 MG tablet Take 500 mg by mouth daily.    Historical Provider, MD    Allergies Patient has no known allergies.  History reviewed. No pertinent family history.  Social History Social History  Substance Use Topics  . Smoking status: Current Every Day Smoker    Packs/day: 0.50    Types: Cigarettes  . Smokeless tobacco: Never Used  . Alcohol use Yes     Comment: occasionally    Review of Systems Constitutional: No fever/chills Eyes: No visual changes. ENT: No sore throat. Cardiovascular: Denies chest pain. Respiratory: Denies shortness of breath. Gastrointestinal: No abdominal pain.  No nausea, no vomiting.  No diarrhea.  No constipation. Genitourinary: Positive  for dysuria. Musculoskeletal: Positive for back pain. Skin: Negative for rash. Neurological: Negative for headaches, focal weakness or numbness.  10-point ROS otherwise negative.  ____________________________________________   PHYSICAL EXAM:  VITAL SIGNS: ED Triage Vitals  Enc Vitals Group     BP 02/10/17 2345 (!) 156/113     Pulse Rate 02/10/17 2345 (!) 118     Resp 02/10/17 2345 18     Temp 02/10/17 2345 97.9 F (36.6 C)     Temp  Source 02/10/17 2345 Oral     SpO2 02/10/17 2345 100 %     Weight 02/10/17 2345 165 lb (74.8 kg)     Height 02/10/17 2345 5\' 8"  (1.727 m)     Head Circumference --      Peak Flow --      Pain Score 02/10/17 2346 9     Pain Loc --      Pain Edu? --      Excl. in [place]? --     Constitutional: Alert and oriented x 4 well appearing nontoxic no diaphoresis speaks in full, clear sentences Eyes: PERRL EOMI. Head: Atraumatic. Nose: No congestion/rhinnorhea. Mouth/Throat: No trismus Neck: No stridor.   Cardiovascular: Normal rate, regular rhythm. Grossly normal heart sounds.  Good peripheral circulation. Respiratory: Normal respiratory effort.  No retractions. Lungs CTAB and moving good air Gastrointestinal: Soft nondistended nontender no rebound no guarding no peritonitis no McBurney's tenderness negative Rovsing's no costovertebral tenderness negative Murphy's Musculoskeletal: No lower extremity edema  No midline back tenderness. No paraspinal tenderness. 5 out of 5 hip flexion and hip extension plantar flexion dorsiflexion. Neurologic:  Normal speech and language. No gross focal neurologic deficits are appreciated. Skin:  Skin is warm, dry and intact. No rash noted. Psychiatric: Mood and affect are normal. Speech and behavior are normal.    ____________________________________________   DIFFERENTIAL  Urinary tract infection, pyelonephritis, musculoskeletal strain ____________________________________________   LABS (all labs ordered are listed, but only abnormal results are displayed)  Labs Reviewed  URINALYSIS, COMPLETE (UACMP) WITH MICROSCOPIC - Abnormal; Notable for the following:       Result Value   Color, Urine YELLOW (*)    APPearance HAZY (*)    Hgb urine dipstick MODERATE (*)    Leukocytes, UA LARGE (*)    Bacteria, UA RARE (*)    Squamous Epithelial / LPF 0-5 (*)    All other components within normal limits  CBC - Abnormal; Notable for the following:    Hemoglobin  11.8 (*)    All other components within normal limits  BASIC METABOLIC PANEL - Abnormal; Notable for the following:    Glucose, Bld 109 (*)    BUN 24 (*)    Creatinine, Ser 1.08 (*)    GFR calc non Af Amer 50 (*)    GFR calc Af Amer 58 (*)    All other components within normal limits  CK    Urinalysis consistent with infection __________________________________________  EKG   ____________________________________________  RADIOLOGY  CT negative for kidney stone ____________________________________________   PROCEDURES  Procedure(s) performed: no  Procedures  Critical Care performed: no  ____________________________________________   INITIAL IMPRESSION / ASSESSMENT AND PLAN / ED COURSE  Pertinent labs & imaging results that were available during my care of the patient were reviewed by me and considered in my medical decision making (see chart for details).  The patient is neurovascularly intact in her legs. She has no red flags for back pain. Her urine is clearly infected but  does have a significant amount of blood she has a history of kidney stones the CT scan obtained to check for infected stone. Fortunately the scan is negative and her pain is improved. She is stable for discharge with treatment with Keflex for an days.      ____________________________________________   FINAL CLINICAL IMPRESSION(S) / ED DIAGNOSES  Final diagnoses:  Chronic bilateral low back pain without sciatica  Pyelonephritis      NEW MEDICATIONS STARTED DURING THIS VISIT:  Discharge Medication List as of 02/11/2017  3:40 AM    START taking these medications   Details  cephALEXin (KEFLEX) 500 MG capsule Take 1 capsule (500 mg total) by mouth 4 (four) times daily., Starting Fri 02/11/2017, Until Mon 02/21/2017, Print         Note:  This document was prepared using Dragon voice recognition software and may include unintentional dictation errors.     Darel Hong,  MD 02/11/17 [phone]

## 2017-02-26 ENCOUNTER — Emergency Department: Payer: Medicare HMO

## 2017-02-26 ENCOUNTER — Encounter: Payer: Self-pay | Admitting: Emergency Medicine

## 2017-02-26 DIAGNOSIS — F17210 Nicotine dependence, cigarettes, uncomplicated: Secondary | ICD-10-CM | POA: Insufficient documentation

## 2017-02-26 DIAGNOSIS — M79672 Pain in left foot: Secondary | ICD-10-CM | POA: Insufficient documentation

## 2017-02-26 DIAGNOSIS — R079 Chest pain, unspecified: Secondary | ICD-10-CM | POA: Diagnosis not present

## 2017-02-26 DIAGNOSIS — I10 Essential (primary) hypertension: Secondary | ICD-10-CM | POA: Diagnosis not present

## 2017-02-26 DIAGNOSIS — M79671 Pain in right foot: Secondary | ICD-10-CM | POA: Insufficient documentation

## 2017-02-26 LAB — CBC
HCT: 33.5 % — ABNORMAL LOW (ref 35.0–47.0)
HEMOGLOBIN: 11.2 g/dL — AB (ref 12.0–16.0)
MCH: 29.2 pg (ref 26.0–34.0)
MCHC: 33.4 g/dL (ref 32.0–36.0)
MCV: 87.5 fL (ref 80.0–100.0)
Platelets: 264 10*3/uL (ref 150–440)
RBC: 3.83 MIL/uL (ref 3.80–5.20)
RDW: 13.4 % (ref 11.5–14.5)
WBC: 7.8 10*3/uL (ref 3.6–11.0)

## 2017-02-26 NOTE — ED Triage Notes (Signed)
Pt ambulatory to triage with no difficulty. Pt reports she was awakened tonight by pain in her chest, left shoulder and both ankles. Pt reports the pain in her chest has made her feel short of breath. Pt talking in full and complete sentences with no difficulty at this time.

## 2017-02-27 ENCOUNTER — Emergency Department
Admission: EM | Admit: 2017-02-27 | Discharge: 2017-02-27 | Disposition: A | Discharge status: Home / Self Care | Payer: Medicare HMO | Attending: Emergency Medicine | Admitting: Emergency Medicine

## 2017-02-27 DIAGNOSIS — M79671 Pain in right foot: Secondary | ICD-10-CM

## 2017-02-27 DIAGNOSIS — M79672 Pain in left foot: Secondary | ICD-10-CM

## 2017-02-27 LAB — BASIC METABOLIC PANEL
Anion gap: 8 (ref 5–15)
BUN: 21 mg/dL — ABNORMAL HIGH (ref 6–20)
CALCIUM: 9.4 mg/dL (ref 8.9–10.3)
CHLORIDE: 105 mmol/L (ref 101–111)
CO2: 26 mmol/L (ref 22–32)
CREATININE: 0.92 mg/dL (ref 0.44–1.00)
GFR calc Af Amer: >60 mL/min (ref >60)
GFR calc non Af Amer: >60 mL/min (ref >60)
GLUCOSE: 95 mg/dL (ref 65–99)
Potassium: 3.2 mmol/L — ABNORMAL LOW (ref 3.5–5.1)
Sodium: 139 mmol/L (ref 135–145)

## 2017-02-27 LAB — TROPONIN I: Troponin I: <0.03 ng/mL (ref <0.03)

## 2017-02-27 NOTE — ED Provider Notes (Signed)
[facility] Emergency Department Provider Note   ____________________________________________   I have reviewed the triage vital signs and the nursing notes.   HISTORY  Chief Complaint Left foot pain  History limited by: Not Limited   HPI Angela Yates is a 71 y.o. female who presents to the emergency department today she also states she has been pain in her right foot. She has had the pain on and off for quite some time. She has seen her doctors in the past gone x-rays without any obvious findings. The patient additionally had some chest pain tonight as well. Is located in her bilateral chest. She denies any concurrent shortness of breath, cough or fevers.   Past Medical History:  Diagnosis Date  . Hypertension     Patient Active Problem List   Diagnosis Date Noted  . Hypertension, essential 03/14/2015  . Vitamin D deficiency 03/14/2015  . 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 Formication 03/14/2015  . Poor historian 03/14/2015    History reviewed. No pertinent surgical history.  Prior to Admission medications   Medication Sig Start Date End Date Taking? Authorizing Provider  acetaminophen (TYLENOL) 500 MG tablet Take 500 mg by mouth every 6 (six) hours as needed.    Historical Provider, MD  amLODipine (NORVASC) 5 MG tablet Take 1 tablet by mouth daily. 11/05/15   Historical Provider, MD  cholecalciferol (VITAMIN D) 1000 UNITS tablet Take 2,000 Units by mouth daily.    Historical Provider, MD  etodolac (LODINE) 200 MG capsule Take 1 capsule (200 mg total) by mouth every 8 (eight) hours. 09/19/16   Loney Hering, MD  fluticasone (FLONASE) 50 MCG/ACT nasal spray Place 1 spray into both nostrils daily. 01/12/17   Historical Provider, MD  hydrochlorothiazide (HYDRODIURIL) 25 MG tablet Take 25 mg by mouth daily.    Historical Provider, MD  ibuprofen (ADVIL,MOTRIN) 200 MG tablet Take 200 mg by mouth every 6  (six) hours as needed.    Historical Provider, MD  lisinopril (PRINIVIL,ZESTRIL) 10 MG tablet Take 1 tablet by mouth daily. 12/26/15   Historical Provider, MD  omeprazole (PRILOSEC) 20 MG capsule Take 20 mg by mouth daily.    Historical Provider, MD  ondansetron (ZOFRAN) 4 MG tablet Take 1 tablet (4 mg total) by mouth daily as needed for nausea or vomiting. Patient not taking: Reported on 02/01/2017 01/07/16   Schuyler Amor, MD  potassium chloride (MICRO-K) 10 MEQ CR capsule Take 1 capsule by mouth daily.  12/29/15   Historical Provider, MD  TRAVATAN Z 0.004 % SOLN ophthalmic solution Place 1 drop into both eyes daily. 12/29/15   Historical Provider, MD  vitamin C (ASCORBIC ACID) 500 MG tablet Take 500 mg by mouth daily.    Historical Provider, MD    Allergies Patient has no known allergies.  History reviewed. No pertinent family history.  Social History Social History  Substance Use Topics  . Smoking status: Current Every Day Smoker    Packs/day: 0.50    Types: Cigarettes  . Smokeless tobacco: Never Used  . Alcohol use Yes     Comment: occasionally    Review of Systems  Constitutional: Negative for fever. Cardiovascular: Positive for chest pain. Respiratory: Negative for shortness of breath. Gastrointestinal: Negative for abdominal pain, vomiting and diarrhea. Genitourinary: Negative for dysuria. Musculoskeletal: Positive for bilateral foot pain. Skin: Negative for rash. Neurological: Negative for headaches, focal weakness or numbness.  10-point ROS otherwise negative.  ____________________________________________  PHYSICAL EXAM:  VITAL SIGNS: ED Triage Vitals  Enc Vitals Group     BP 02/26/17 2340 107/78     Pulse Rate 02/26/17 2340 95     Resp 02/26/17 2340 18     Temp 02/26/17 2340 98.2 F (36.8 C)     Temp Source 02/26/17 2340 Oral     SpO2 02/26/17 2340 99 %     Weight 02/26/17 2340 165 lb (74.8 kg)     Height 02/26/17 2340 5\' 8"  (1.727 m)     Head  Circumference --      Peak Flow --      Pain Score 02/26/17 2339 7   Constitutional: Alert and oriented. Well appearing and in no distress. Eyes: Conjunctivae are normal. Normal extraocular movements. ENT   Head: Normocephalic and atraumatic.   Nose: No congestion/rhinnorhea.   Mouth/Throat: Mucous membranes are moist.   Neck: No stridor. Hematological/Lymphatic/Immunilogical: No cervical lymphadenopathy. Cardiovascular: Normal rate, regular rhythm.  No murmurs, rubs, or gallops.  Respiratory: Normal respiratory effort without tachypnea nor retractions. Breath sounds are clear and equal bilaterally. No wheezes/rales/rhonchi. Gastrointestinal: Soft and non tender. No rebound. No guarding.  Genitourinary: Deferred Musculoskeletal: Normal range of motion in all extremities. No lower extremity edema. DP 2+ in bilateral feet. No deformity. No swelling. Non tender to palpation. Neurologic:  Normal speech and language. No gross focal neurologic deficits are appreciated.  Skin:  Skin is warm, dry and intact. No rash noted. Psychiatric: Mood and affect are normal. Speech and behavior are normal. Patient exhibits appropriate insight and judgment.  ____________________________________________    LABS (pertinent positives/negatives)  Labs Reviewed  BASIC METABOLIC PANEL - Abnormal; Notable for the following:       Result Value   Potassium 3.2 (*)    BUN 21 (*)    All other components within normal limits  CBC - Abnormal; Notable for the following:    Hemoglobin 11.2 (*)    HCT 33.5 (*)    All other components within normal limits  TROPONIN I     ____________________________________________   EKG  I, Nance Pear, attending physician, personally viewed and interpreted this EKG  EKG Time: 2342 Rate: 101 Rhythm: sinus tachycardia Axis: normal Intervals: qtc 477 QRS: narrow, LVH ST changes: no st elevation Impression: abnormal  ekg   ____________________________________________    RADIOLOGY  CXR IMPRESSION:  No acute cardiopulmonary process seen.   ____________________________________________   PROCEDURES  Procedures  ____________________________________________   INITIAL IMPRESSION / ASSESSMENT AND PLAN / ED COURSE  Pertinent labs & imaging results that were available during my care of the patient were reviewed by me and considered in my medical decision making (see chart for details).  Patient presented to the emergency department today with primary concern for left foot pain. Says been a recurrent problem for the patient on today's exam no concerning findings of the foot. Patient initially had some concerns for chest pain however EKG chest x-ray and  troponin without any concerning findings. Will give patient podiatry follow up information.  ____________________________________________   FINAL CLINICAL IMPRESSION(S) / ED DIAGNOSES  Final diagnoses:  Pain in both feet     Note: This dictation was prepared with Dragon dictation. Any transcriptional errors that result from this process are unintentional     Nance Pear, MD 02/27/17 [phone]

## 2017-02-27 NOTE — Discharge Instructions (Signed)
Please seek medical attention for any high fevers, chest pain, shortness of breath, change in behavior, persistent vomiting, bloody stool or any other new or concerning symptoms.  

## 2017-03-31 ENCOUNTER — Ambulatory Visit
Admission: EM | Admit: 2017-03-31 | Discharge: 2017-03-31 | Disposition: A | Discharge status: Home / Self Care | Payer: Medicare HMO | Attending: Family Medicine | Admitting: Family Medicine

## 2017-03-31 DIAGNOSIS — R252 Cramp and spasm: Secondary | ICD-10-CM

## 2017-03-31 DIAGNOSIS — M79672 Pain in left foot: Secondary | ICD-10-CM | POA: Diagnosis not present

## 2017-03-31 DIAGNOSIS — M79671 Pain in right foot: Secondary | ICD-10-CM | POA: Diagnosis not present

## 2017-03-31 LAB — BASIC METABOLIC PANEL
Anion gap: 6 (ref 5–15)
BUN: 17 mg/dL (ref 6–20)
CO2: 28 mmol/L (ref 22–32)
Calcium: 9 mg/dL (ref 8.9–10.3)
Chloride: 102 mmol/L (ref 101–111)
Creatinine, Ser: 0.76 mg/dL (ref 0.44–1.00)
GFR calc Af Amer: >60 mL/min (ref >60)
GFR calc non Af Amer: >60 mL/min (ref >60)
Glucose, Bld: 96 mg/dL (ref 65–99)
Potassium: 3.6 mmol/L (ref 3.5–5.1)
Sodium: 136 mmol/L (ref 135–145)

## 2017-03-31 NOTE — ED Provider Notes (Signed)
CSN: [id number]     Arrival date & time 03/31/17  1220 History   First MD Initiated Contact with Patient 03/31/17 1325     Chief Complaint  Patient presents with  . Foot Swelling   (Consider location/radiation/quality/duration/timing/severity/associated sxs/prior Treatment) HPI  This is a 71 year old female who presents with bilateral foot pain that occurred earlier today while she was watching TV and lying on the couch. She states that her toes began to curl and cramp her foot terribly. This lasted approximately 5 minutes and then resolved. It has not recurred since then. She tells me that she has not taken any of her medications for the last month and is planning on going to the drugstore tomorrow to refill them. Her blood pressure is elevated today 176/94 but most likely on the basis of her noncompliance with medications.       Past Medical History:  Diagnosis Date  . Hypertension    History reviewed. No pertinent surgical history. History reviewed. No pertinent family history. Social History  Substance Use Topics  . Smoking status: Current Every Day Smoker    Packs/day: 0.50    Types: Cigarettes  . Smokeless tobacco: Never Used  . Alcohol use Yes     Comment: occasionally   OB History    No data available     Review of Systems  Constitutional: Positive for activity change. Negative for chills, fatigue and fever.  Musculoskeletal: Positive for myalgias.  All other systems reviewed and are negative.   Allergies  Patient has no known allergies.  Home Medications   Prior to Admission medications   Medication Sig Start Date End Date Taking? Authorizing Provider  acetaminophen (TYLENOL) 500 MG tablet Take 500 mg by mouth every 6 (six) hours as needed.   Yes Historical Provider, MD  amLODipine (NORVASC) 5 MG tablet Take 1 tablet by mouth daily. 11/05/15  Yes Historical Provider, MD  hydrochlorothiazide (HYDRODIURIL) 25 MG tablet Take 25 mg by mouth daily.   Yes Historical  Provider, MD  lisinopril (PRINIVIL,ZESTRIL) 10 MG tablet Take 1 tablet by mouth daily. 12/26/15  Yes Historical Provider, MD  omeprazole (PRILOSEC) 20 MG capsule Take 20 mg by mouth daily.   Yes Historical Provider, MD  potassium chloride (MICRO-K) 10 MEQ CR capsule Take 1 capsule by mouth daily.  12/29/15  Yes Historical Provider, MD  TRAVATAN Z 0.004 % SOLN ophthalmic solution Place 1 drop into both eyes daily. 12/29/15  Yes Historical Provider, MD  vitamin C (ASCORBIC ACID) 500 MG tablet Take 500 mg by mouth daily.   Yes Historical Provider, MD   Meds Ordered and Administered this Visit  Medications - No data to display  BP (!) 176/94 (BP Location: Left Arm)   Pulse 86   Temp 98.5 F (36.9 C) (Oral)   Resp 18   Ht 5\' 9"  (1.753 m)   Wt 165 lb (74.8 kg)   SpO2 100%   BMI 24.37 kg/m  No data found.   Physical Exam  Constitutional: She is oriented to person, place, and time. She appears well-developed and well-nourished. No distress.  HENT:  Head: Normocephalic and atraumatic.  Eyes: Pupils are equal, round, and reactive to light.  Neck: Normal range of motion.  Musculoskeletal: Normal range of motion. She exhibits no edema, tenderness or deformity.  Examination of both feet shows no obvious spasm at the present time. Pulses are equal bilaterally on the dorsalis pedis. Sensation is intact distally. Ankle range of motion is full and comfortable.  I'm unable to precipitate any spasms at this time.  Neurological: She is alert and oriented to person, place, and time.  Skin: Skin is warm and dry. She is not diaphoretic.  Psychiatric: She has a normal mood and affect. Her behavior is normal. Judgment and thought content normal.  Nursing note and vitals reviewed.   Urgent Care Course     Procedures (including critical care time)  Labs Review Labs Reviewed  BASIC METABOLIC PANEL    Imaging Review No results found.   Visual Acuity Review  Right Eye Distance:   Left Eye  Distance:   Bilateral Distance:    Right Eye Near:   Left Eye Near:    Bilateral Near:         MDM   1. Muscle cramp    Discharge Medication List as of 03/31/2017  2:04 PM    Plan: 1. Test/x-ray results and diagnosis reviewed with patient 2. rx as per orders; risks, benefits, potential side effects reviewed with patient 3. Recommend supportive treatment with Sharing she takes her medications as prescribed. I've also recommended that she increase her fluid intake. Sure that the muscle spasms so are not present at the present time and will likely intermittent muscle spasms that may or may not recur but should have no consequence. She should stretch her feet out more frequently and if this is the spasms do occur stretch out her toes and massage her feet generally to alleviate the spasm. She should follow-up with her primary care physician if she is not improving and certainly if she decides not to take her medications 4. F/u prn if symptoms worsen or don't improve     Lorin Picket, PA-C 03/31/17 1927

## 2017-03-31 NOTE — ED Triage Notes (Signed)
Patient complains of bilateral foot pain. Patient states that both of her feet are stiff and feels like her toes are swollen. Patient states that she noticed the pain earlier today.

## 2017-04-15 ENCOUNTER — Emergency Department: Payer: Medicare HMO

## 2017-04-15 ENCOUNTER — Emergency Department
Admission: EM | Admit: 2017-04-15 | Discharge: 2017-04-15 | Disposition: A | Discharge status: Home / Self Care | Payer: Medicare HMO | Attending: Emergency Medicine | Admitting: Emergency Medicine

## 2017-04-15 ENCOUNTER — Encounter: Payer: Self-pay | Admitting: Emergency Medicine

## 2017-04-15 DIAGNOSIS — I83893 Varicose veins of bilateral lower extremities with other complications: Secondary | ICD-10-CM

## 2017-04-15 DIAGNOSIS — Z79899 Other long term (current) drug therapy: Secondary | ICD-10-CM | POA: Diagnosis not present

## 2017-04-15 DIAGNOSIS — F17210 Nicotine dependence, cigarettes, uncomplicated: Secondary | ICD-10-CM | POA: Insufficient documentation

## 2017-04-15 DIAGNOSIS — I10 Essential (primary) hypertension: Secondary | ICD-10-CM | POA: Insufficient documentation

## 2017-04-15 DIAGNOSIS — M79604 Pain in right leg: Secondary | ICD-10-CM | POA: Diagnosis not present

## 2017-04-15 DIAGNOSIS — M79605 Pain in left leg: Secondary | ICD-10-CM | POA: Diagnosis present

## 2017-04-15 LAB — CBC WITH DIFFERENTIAL/PLATELET
Basophils Absolute: 0 10*3/uL (ref 0–0.1)
Basophils Relative: 0 %
EOS ABS: 0.2 10*3/uL (ref 0–0.7)
EOS PCT: 3 %
HCT: 34.5 % — ABNORMAL LOW (ref 35.0–47.0)
HEMOGLOBIN: 11.7 g/dL — AB (ref 12.0–16.0)
LYMPHS ABS: 2 10*3/uL (ref 1.0–3.6)
LYMPHS PCT: 37 %
MCH: 29.4 pg (ref 26.0–34.0)
MCHC: 33.9 g/dL (ref 32.0–36.0)
MCV: 87 fL (ref 80.0–100.0)
MONOS PCT: 9 %
Monocytes Absolute: 0.5 10*3/uL (ref 0.2–0.9)
NEUTROS PCT: 51 %
Neutro Abs: 2.7 10*3/uL (ref 1.4–6.5)
Platelets: 286 10*3/uL (ref 150–440)
RBC: 3.97 MIL/uL (ref 3.80–5.20)
RDW: 14 % (ref 11.5–14.5)
WBC: 5.4 10*3/uL (ref 3.6–11.0)

## 2017-04-15 LAB — BASIC METABOLIC PANEL
Anion gap: 9 (ref 5–15)
BUN: 15 mg/dL (ref 6–20)
CHLORIDE: 105 mmol/L (ref 101–111)
CO2: 26 mmol/L (ref 22–32)
CREATININE: 0.84 mg/dL (ref 0.44–1.00)
Calcium: 9.3 mg/dL (ref 8.9–10.3)
GFR calc Af Amer: >60 mL/min (ref >60)
GFR calc non Af Amer: >60 mL/min (ref >60)
Glucose, Bld: 99 mg/dL (ref 65–99)
Potassium: 3.5 mmol/L (ref 3.5–5.1)
Sodium: 140 mmol/L (ref 135–145)

## 2017-04-15 MED ORDER — TRAMADOL HCL 50 MG PO TABS: 50.0000 mg | ORAL_TABLET | Freq: Two times a day (BID) | ORAL | 0 refills | Status: DC | PRN

## 2017-04-15 NOTE — ED Triage Notes (Signed)
Pt reports bilateral anterior leg pain from the knee down. Pt reports she's been taking tylenol without relief.

## 2017-04-15 NOTE — ED Notes (Signed)
Patient transported to ultrasound.

## 2017-04-15 NOTE — ED Provider Notes (Signed)
[facility] Emergency Department Provider Note  ____________________________________________   First MD Initiated Contact with Patient 04/15/17 1301     (approximate)  I have reviewed the triage vital signs and the nursing notes.   HISTORY  Chief Complaint Leg Pain    HPI Angela Yates is a 71 y.o. female patient complaining of bilateral increasing leg pain for 2 months. Patient stated no relief from pain with over-the-counter anti-inflammatory medications. Patient state there  intermittent cramping sensation to the bilateral legs. Patient denies dyspnea /chest pain with this complaint. Patient has a history of chronic venous stasis dermatitis of the left lower extremity.  Past Medical History:  Diagnosis Date  . Hypertension     Patient Active Problem List   Diagnosis Date Noted  . Hypertension, essential 03/14/2015  . Vitamin D deficiency 03/14/2015  . 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 Formication 03/14/2015  . Poor historian 03/14/2015    No past surgical history on file.  Prior to Admission medications   Medication Sig Start Date End Date Taking? Authorizing Provider  acetaminophen (TYLENOL) 500 MG tablet Take 500 mg by mouth every 6 (six) hours as needed.    [provider]  amLODipine (NORVASC) 5 MG tablet Take 1 tablet by mouth daily. 11/05/15   [provider]  hydrochlorothiazide (HYDRODIURIL) 25 MG tablet Take 25 mg by mouth daily.    [provider]  lisinopril (PRINIVIL,ZESTRIL) 10 MG tablet Take 1 tablet by mouth daily. 12/26/15   [provider]  omeprazole (PRILOSEC) 20 MG capsule Take 20 mg by mouth daily.    [provider]  potassium chloride (MICRO-K) 10 MEQ CR capsule Take 1 capsule by mouth daily.  12/29/15   [provider]  traMADol (ULTRAM) 50 MG tablet Take 1 tablet (50 mg total) by mouth every 12 (twelve) hours as needed for  severe pain. 04/15/17   Sable Feil, PA-C  TRAVATAN Z 0.004 % SOLN ophthalmic solution Place 1 drop into both eyes daily. 12/29/15   [provider]  vitamin C (ASCORBIC ACID) 500 MG tablet Take 500 mg by mouth daily.    [provider]    Allergies Patient has no known allergies.  No family history on file.  Social History Social History  Substance Use Topics  . Smoking status: Current Every Day Smoker    Packs/day: 0.50    Types: Cigarettes  . Smokeless tobacco: Never Used  . Alcohol use Yes     Comment: occasionally    Review of Systems  Constitutional: No fever/chills Eyes: No visual changes. ENT: No sore throat. Cardiovascular: Denies chest pain. Respiratory: Denies shortness of breath. Gastrointestinal: No abdominal pain.  No nausea, no vomiting.  No diarrhea.  No constipation. Genitourinary: Negative for dysuria. Musculoskeletal: Negative for back pain. Skin: Negative for rash. Neurological: Negative for headaches, focal weakness or numbness.   ____________________________________________   PHYSICAL EXAM:  VITAL SIGNS: ED Triage Vitals [04/15/17 1231]  Enc Vitals Group     BP 121/86     Pulse Rate 88     Resp 16     Temp 98.5 F (36.9 C)     Temp Source Oral     SpO2 100 %     Weight 165 lb (74.8 kg)     Height 5\' 9"  (1.753 m)     Head Circumference      Peak Flow      Pain Score 4  Pain Loc      Pain Edu?      Excl. in [place]?     Constitutional: Alert and oriented. Well appearing and in no acute distress. Cardiovascular: Normal rate, regular rhythm. Grossly normal heart sounds.  Good peripheral circulation with multiple varicose veins.Marland Kitchen Respiratory: Normal respiratory effort.  No retractions. Lungs CTAB. Gastrointestinal: Soft and nontender. No distention. No abdominal bruits. No CVA tenderness. Musculoskeletal: No obvious deformity to the bilateral lower extremity. Not distended veins.   Neurologic:  Normal speech and  language. No gross focal neurologic deficits are appreciated. No gait instability. Skin:  Skin is warm, dry and intact. No rash noted.  Psychiatric: Mood and affect are normal. Speech and behavior are normal.  ____________________________________________   LABS (all labs ordered are listed, but only abnormal results are displayed)  Labs Reviewed  CBC WITH DIFFERENTIAL/PLATELET - Abnormal; Notable for the following:       Result Value   Hemoglobin 11.7 (*)    HCT 34.5 (*)    All other components within normal limits  BASIC METABOLIC PANEL   ____________________________________________  EKG   ____________________________________________  RADIOLOGY   ____________________________________________   PROCEDURES  Procedure(s) performed: None  Procedures  Critical Care performed: No  ____________________________________________   INITIAL IMPRESSION / ASSESSMENT AND PLAN / ED COURSE  Pertinent labs & imaging results that were available during my care of the patient were reviewed by me and considered in my medical decision making (see chart for details).  Myalgia confined to bilateral leg. Discussed no jaw trauma results with patient. Patient advised to continue previous medications and follow-up with her treating doctor  ____________________________________________   FINAL CLINICAL IMPRESSION(S) / ED DIAGNOSES  Final diagnoses:  Bilateral leg pain      NEW MEDICATIONS STARTED DURING THIS VISIT:  New Prescriptions   TRAMADOL (ULTRAM) 50 MG TABLET    Take 1 tablet (50 mg total) by mouth every 12 (twelve) hours as needed for severe pain.     Note:  This document was prepared using Dragon voice recognition software and may include unintentional dictation errors.    Sable Feil, PA-C 04/15/17 1445    Lisa Roca, MD 04/15/17 [phone]

## 2017-06-15 ENCOUNTER — Encounter: Payer: Self-pay | Admitting: Intensive Care

## 2017-06-15 ENCOUNTER — Emergency Department: Payer: Medicare HMO

## 2017-06-15 ENCOUNTER — Ambulatory Visit (INDEPENDENT_AMBULATORY_CARE_PROVIDER_SITE_OTHER)
Admission: EM | Admit: 2017-06-15 | Discharge: 2017-06-15 | Disposition: A | Discharge status: Other Acute Inpatient Hospital | Payer: Medicare HMO | Source: Home / Self Care | Attending: Family Medicine | Admitting: Family Medicine

## 2017-06-15 ENCOUNTER — Emergency Department
Admission: EM | Admit: 2017-06-15 | Discharge: 2017-06-15 | Disposition: A | Discharge status: Home / Self Care | Payer: Medicare HMO | Attending: Emergency Medicine | Admitting: Emergency Medicine

## 2017-06-15 DIAGNOSIS — F17210 Nicotine dependence, cigarettes, uncomplicated: Secondary | ICD-10-CM | POA: Insufficient documentation

## 2017-06-15 DIAGNOSIS — I509 Heart failure, unspecified: Secondary | ICD-10-CM | POA: Diagnosis not present

## 2017-06-15 DIAGNOSIS — Z79899 Other long term (current) drug therapy: Secondary | ICD-10-CM

## 2017-06-15 DIAGNOSIS — I110 Hypertensive heart disease with heart failure: Secondary | ICD-10-CM | POA: Insufficient documentation

## 2017-06-15 DIAGNOSIS — R609 Edema, unspecified: Secondary | ICD-10-CM | POA: Diagnosis not present

## 2017-06-15 DIAGNOSIS — I878 Other specified disorders of veins: Secondary | ICD-10-CM | POA: Insufficient documentation

## 2017-06-15 DIAGNOSIS — R600 Localized edema: Secondary | ICD-10-CM | POA: Diagnosis not present

## 2017-06-15 DIAGNOSIS — E559 Vitamin D deficiency, unspecified: Secondary | ICD-10-CM

## 2017-06-15 DIAGNOSIS — R0602 Shortness of breath: Secondary | ICD-10-CM

## 2017-06-15 DIAGNOSIS — I10 Essential (primary) hypertension: Secondary | ICD-10-CM

## 2017-06-15 LAB — CBC WITH DIFFERENTIAL/PLATELET
Basophils Absolute: 0 10*3/uL (ref 0–0.1)
Basophils Relative: 1 %
EOS PCT: 4 %
Eosinophils Absolute: 0.3 10*3/uL (ref 0–0.7)
HCT: 34.8 % — ABNORMAL LOW (ref 35.0–47.0)
Hemoglobin: 11.8 g/dL — ABNORMAL LOW (ref 12.0–16.0)
LYMPHS PCT: 23 %
Lymphs Abs: 1.7 10*3/uL (ref 1.0–3.6)
MCH: 29.4 pg (ref 26.0–34.0)
MCHC: 34 g/dL (ref 32.0–36.0)
MCV: 86.3 fL (ref 80.0–100.0)
Monocytes Absolute: 0.6 10*3/uL (ref 0.2–0.9)
Monocytes Relative: 8 %
Neutro Abs: 4.8 10*3/uL (ref 1.4–6.5)
Neutrophils Relative %: 64 %
PLATELETS: 308 10*3/uL (ref 150–440)
RBC: 4.03 MIL/uL (ref 3.80–5.20)
RDW: 13.4 % (ref 11.5–14.5)
WBC: 7.4 10*3/uL (ref 3.6–11.0)

## 2017-06-15 LAB — BRAIN NATRIURETIC PEPTIDE: B Natriuretic Peptide: 12 pg/mL (ref 0.0–100.0)

## 2017-06-15 LAB — HEPATIC FUNCTION PANEL
ALT: 17 U/L (ref 14–54)
AST: 25 U/L (ref 15–41)
Albumin: 4.4 g/dL (ref 3.5–5.0)
Alkaline Phosphatase: 60 U/L (ref 38–126)
BILIRUBIN TOTAL: 0.6 mg/dL (ref 0.3–1.2)
Total Protein: 8.8 g/dL — ABNORMAL HIGH (ref 6.5–8.1)

## 2017-06-15 LAB — BASIC METABOLIC PANEL
ANION GAP: 11 (ref 5–15)
BUN: 14 mg/dL (ref 6–20)
CALCIUM: 9.7 mg/dL (ref 8.9–10.3)
CO2: 27 mmol/L (ref 22–32)
Chloride: 100 mmol/L — ABNORMAL LOW (ref 101–111)
Creatinine, Ser: 0.99 mg/dL (ref 0.44–1.00)
GFR calc Af Amer: >60 mL/min (ref >60)
GFR calc non Af Amer: 55 mL/min — ABNORMAL LOW (ref >60)
GLUCOSE: 96 mg/dL (ref 65–99)
Potassium: 3.3 mmol/L — ABNORMAL LOW (ref 3.5–5.1)
Sodium: 138 mmol/L (ref 135–145)

## 2017-06-15 LAB — TSH: TSH: 2.949 u[IU]/mL (ref 0.350–4.500)

## 2017-06-15 LAB — FIBRIN DERIVATIVES D-DIMER (ARMC ONLY): Fibrin derivatives D-dimer (ARMC): >7500 — ABNORMAL HIGH (ref 0.00–499.00)

## 2017-06-15 LAB — TROPONIN I: Troponin I: <0.03 ng/mL (ref <0.03)

## 2017-06-15 MED ORDER — IOPAMIDOL (ISOVUE-370) INJECTION 76%
75.0000 mL | Freq: Once | INTRAVENOUS | Status: AC | PRN
  Administered 2017-06-15: 75 mL via INTRAVENOUS

## 2017-06-15 NOTE — ED Provider Notes (Signed)
[facility]    CSN: [id number] Arrival date & time: 06/15/17  1259     History   Chief Complaint Chief Complaint  Patient presents with  . Leg Swelling    HPI Angela Yates is a 71 y.o. female.   Patient reports shortness of breath started last day or 2 but sheswelling in the legs she puts that she is swelling up into her thighs. No previous history of CHF before she is on a diuretic when she lays down shortness of breath is much worse. She denies having a heart attack before but she has had chest pain before. She denies having active chest pain this time but the shortness of breath has quickly gotten worse today. She states she's had racing heart before but not sustained like this before either. She does smoke unfortunately and her son who used to live with her as recently moved out he doesn't arm. She does have chronic venous stasis and she is a poor historian.   The history is provided by the patient. No language interpreter was used.    Past Medical History:  Diagnosis Date  . Hypertension     Patient Active Problem List   Diagnosis Date Noted  . Hypertension, essential 03/14/2015  . Vitamin D deficiency 03/14/2015  . 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 Formication 03/14/2015  . Poor historian 03/14/2015    History reviewed. No pertinent surgical history.  OB History    No data available       Home Medications    Prior to Admission medications   Medication Sig Start Date End Date Taking? Authorizing Provider  acetaminophen (TYLENOL) 500 MG tablet Take 500 mg by mouth every 6 (six) hours as needed.   Yes [provider]  amLODipine (NORVASC) 5 MG tablet Take 1 tablet by mouth daily. 11/05/15  Yes [provider]  hydrochlorothiazide (HYDRODIURIL) 25 MG tablet Take 25 mg by mouth daily.   Yes [provider]  lisinopril (PRINIVIL,ZESTRIL) 10 MG tablet Take 1 tablet by mouth  daily. 12/26/15  Yes [provider]  omeprazole (PRILOSEC) 20 MG capsule Take 20 mg by mouth daily.   Yes [provider]  potassium chloride (MICRO-K) 10 MEQ CR capsule Take 1 capsule by mouth daily.  12/29/15  Yes [provider]  traMADol (ULTRAM) 50 MG tablet Take 1 tablet (50 mg total) by mouth every 12 (twelve) hours as needed for severe pain. 04/15/17  Yes Sable Feil, PA-C  TRAVATAN Z 0.004 % SOLN ophthalmic solution Place 1 drop into both eyes daily. 12/29/15  Yes [provider]  vitamin C (ASCORBIC ACID) 500 MG tablet Take 500 mg by mouth daily.   Yes [provider]    Family History History reviewed. No pertinent family history.  Social History Social History  Substance Use Topics  . Smoking status: Current Every Day Smoker    Packs/day: 0.50    Types: Cigarettes  . Smokeless tobacco: Never Used  . Alcohol use Yes     Comment: occasionally     Allergies   Patient has no known allergies.   Review of Systems Review of Systems  Eyes: Negative for discharge.  Respiratory: Positive for shortness of breath.   Cardiovascular: Positive for leg swelling.  All other systems reviewed and are negative.    Physical Exam Triage Vital Signs ED Triage Vitals  Enc Vitals Group     BP 06/15/17 1312 134/82  Pulse Rate 06/15/17 1312 (!) 125     Resp 06/15/17 1312 18     Temp 06/15/17 1312 98 F (36.7 C)     Temp Source 06/15/17 1312 Oral     SpO2 06/15/17 1312 100 %     Weight 06/15/17 1310 161 lb (73 kg)     Height --      Head Circumference --      Peak Flow --      Pain Score 06/15/17 1310 0     Pain Loc --      Pain Edu? --      Excl. in [place]? --    No data found.   Updated Vital Signs BP 134/82 (BP Location: Left Arm)   Pulse (!) 125   Temp 98 F (36.7 C) (Oral)   Resp 18   Wt 161 lb (73 kg)   SpO2 100%   BMI 23.78 kg/m   Visual Acuity Right Eye Distance:   Left Eye Distance:   Bilateral Distance:      Right Eye Near:   Left Eye Near:    Bilateral Near:     Physical Exam  Constitutional: She appears well-developed and well-nourished.  Non-toxic appearance. She does not have a sickly appearance. She appears ill. No distress.  HENT:  Head: Normocephalic and atraumatic.  Right Ear: External ear normal.  Left Ear: External ear normal.  Nose: Nose normal.  Eyes: Pupils are equal, round, and reactive to light.  Neck: Normal range of motion. Neck supple. JVD present. No tracheal deviation present.  Cardiovascular: Tachycardia present.   JVD presents with palpation of the right upper quadrant  Pulmonary/Chest: She has decreased breath sounds.  Abdominal: Soft. She exhibits distension.  Musculoskeletal: She exhibits edema.  Neurological: She is alert.  Skin: Skin is warm.  Psychiatric: Her speech is delayed.  Vitals reviewed.    UC Treatments / Results  Labs (all labs ordered are listed, but only abnormal results are displayed) Labs Reviewed - No data to display  EKG  EKG Interpretation None      ED ECG REPORT I, Amandajo Gonder H, the attending physician, personally viewed and interpreted this ECG.   Date: 06/15/2017  EKG Time:13:19:15  Rate: 120  Rhythm: sinus tachycardia, left ventricular hypertrophy  [id number]  Intervals:none  ST&T Change: none   Radiology No results found.  Procedures Procedures (including critical care time)  Medications Ordered in UC Medications - No data to display   Initial Impression / Assessment and Plan / UC Course  I have reviewed the triage vital signs and the nursing notes.  Pertinent labs & imaging results that were available during my care of the patient were reviewed by me and considered in my medical decision making (see chart for details).     Patient appears to be in CHF with no history of CHF before had not been on diuretic according to her ongoing recommend she goes to the hospital for further treatment evaluation. She is  now for me that she drove here but she has difficulty getting here because of the shortness of breath she is agreeable to go by EMS to [facility]. Discussed the case with charge nurse [facility] triage nurse Vaughan Basta for transfer  Final Clinical Impressions(s) / UC Diagnoses   Final diagnoses:  Peripheral edema  Leg edema  Essential hypertension  Acute congestive heart failure, unspecified heart failure type [facility])    New Prescriptions New Prescriptions   No medications on file  Note: This dictation was prepared with Dragon dictation along with smaller phrase technology. Any transcriptional errors that result from this process are unintentional.   Frederich Cha, MD 06/15/17 1425

## 2017-06-15 NOTE — ED Triage Notes (Signed)
Patient arrived by EMS from [facility] urgent care for fluid retention in feet. Patient reports feeling SOB at night. Patient states "my PCP told me I have beg stages of CHF"

## 2017-06-15 NOTE — ED Triage Notes (Signed)
Pt c/o holding fluids. She says she is voiding, but she is retaining fluid.

## 2017-06-15 NOTE — ED Notes (Signed)
EMS called to transport patient to [facility] ED 

## 2017-06-15 NOTE — ED Notes (Signed)
Patient transported to CT 

## 2017-06-15 NOTE — Discharge Instructions (Signed)
Please make an appointment to establish care with cardiology in the near future. Fortunately today your blood work and CT scan were reassuring and it does not look like urine heart failure. Please return to the emergency department for any concerns.  It was a pleasure to take care of you today, and thank you for coming to our emergency department.  If you have any questions or concerns before leaving please ask the nurse to grab me and I'm more than happy to go through your aftercare instructions again.  If you were prescribed any opioid pain medication today such as Norco, Vicodin, Percocet, morphine, hydrocodone, or oxycodone please make sure you do not drive when you are taking this medication as it can alter your ability to drive safely.  If you have any concerns once you are home that you are not improving or are in fact getting worse before you can make it to your follow-up appointment, please do not hesitate to call 911 and come back for further evaluation.  Darel Hong MD  Results for orders placed or performed during the hospital encounter of 01/60/10  Basic metabolic panel  Result Value Ref Range   Sodium 138 135 - 145 mmol/L   Potassium 3.3 (L) 3.5 - 5.1 mmol/L   Chloride 100 (L) 101 - 111 mmol/L   CO2 27 22 - 32 mmol/L   Glucose, Bld 96 65 - 99 mg/dL   BUN 14 6 - 20 mg/dL   Creatinine, Ser 0.99 0.44 - 1.00 mg/dL   Calcium 9.7 8.9 - 10.3 mg/dL   GFR calc non Af Amer 55 (L) >60 mL/min   GFR calc Af Amer >60 >60 mL/min   Anion gap 11 5 - 15  Hepatic function panel  Result Value Ref Range   Total Protein 8.8 (H) 6.5 - 8.1 g/dL   Albumin 4.4 3.5 - 5.0 g/dL   AST 25 15 - 41 U/L   ALT 17 14 - 54 U/L   Alkaline Phosphatase 60 38 - 126 U/L   Total Bilirubin 0.6 0.3 - 1.2 mg/dL   Bilirubin, Direct <0.1 (L) 0.1 - 0.5 mg/dL   Indirect Bilirubin NOT CALCULATED 0.3 - 0.9 mg/dL  Troponin I  Result Value Ref Range   Troponin I <0.03 <0.03 ng/mL  Brain natriuretic peptide  Result  Value Ref Range   B Natriuretic Peptide 12.0 0.0 - 100.0 pg/mL  CBC with Differential  Result Value Ref Range   WBC 7.4 3.6 - 11.0 K/uL   RBC 4.03 3.80 - 5.20 MIL/uL   Hemoglobin 11.8 (L) 12.0 - 16.0 g/dL   HCT 34.8 (L) 35.0 - 47.0 %   MCV 86.3 80.0 - 100.0 fL   MCH 29.4 26.0 - 34.0 pg   MCHC 34.0 32.0 - 36.0 g/dL   RDW 13.4 11.5 - 14.5 %   Platelets 308 150 - 440 K/uL   Neutrophils Relative % 64 %   Neutro Abs 4.8 1.4 - 6.5 K/uL   Lymphocytes Relative 23 %   Lymphs Abs 1.7 1.0 - 3.6 K/uL   Monocytes Relative 8 %   Monocytes Absolute 0.6 0.2 - 0.9 K/uL   Eosinophils Relative 4 %   Eosinophils Absolute 0.3 0 - 0.7 K/uL   Basophils Relative 1 %   Basophils Absolute 0.0 0 - 0.1 K/uL  TSH  Result Value Ref Range   TSH 2.949 0.350 - 4.500 uIU/mL  Fibrin derivatives D-Dimer  Result Value Ref Range   Fibrin derivatives D-dimer (  [facility]) >7,500.00 (H) 0.00 - 499.00   Ct Angio Chest Pe W/cm &/or Wo Cm  Result Date: 06/15/2017 CLINICAL DATA:  Shortness of Breath EXAM: CT ANGIOGRAPHY CHEST WITH CONTRAST TECHNIQUE: Multidetector CT imaging of the chest was performed using the standard protocol during bolus administration of intravenous contrast. Multiplanar CT image reconstructions and MIPs were obtained to evaluate the vascular anatomy. CONTRAST:  75 mL Isovue 370 nonionic COMPARISON:  Chest radiograph June 15, 2017 FINDINGS: Cardiovascular: There is no demonstrable pulmonary embolus. There is no appreciable thoracic aortic aneurysm or dissection. Visualized great vessels appear unremarkable. There are foci of atherosclerotic calcification in the aorta. Pericardium is not appreciably thickened. There is slight coronary artery calcification. Mediastinum/Nodes: Thyroid appears unremarkable. There is no appreciable thoracic adenopathy. There is a small hiatal hernia. There is moderate air in the midesophagus. Lungs/Pleura: There is slight atelectatic change in scarring in the lung bases. There is also  slight scarring in the left upper lobe anteriorly. There is no edema or consolidation. No pleural effusion or pleural thickening evident. Upper Abdomen: Visualized upper abdominal structures appear unremarkable except for atherosclerotic calcification at the origin of the left renal artery. Musculoskeletal: There is degenerative change in the midthoracic spine. There are no blastic or lytic bone lesions. Review of the MIP images confirms the above findings. IMPRESSION: 1.  No demonstrable pulmonary embolus. 2. Areas of aortic and great vessel atherosclerosis. Minimal coronary artery calcification. No thoracic aortic aneurysm or dissection. 3. Areas of mild atelectasis and scarring. No lung edema or consolidation. 4.  No evident adenopathy. 5.  Small hiatal hernia. 6. There appears to be hemodynamically significant stenosis stable calcification at the origin of the left renal artery. Question whether patient is hypertensive in this regard. Aortic Atherosclerosis (ICD10-I70.0). Electronically Signed   By: Lowella Grip III M.D.   On: 06/15/2017 17:52   Dg Chest Port 1 View  Result Date: 06/15/2017 CLINICAL DATA:  Fluid retention. EXAM: PORTABLE CHEST 1 VIEW COMPARISON:  02/26/2017 . FINDINGS: Mediastinum hilar structures normal. Stable cardiomegaly. Lungs are clear of infiltrates. Slight elevation left hemidiaphragm versus subpulmonic small fusion noted. No pneumothorax. IMPRESSION: 1. Stable cardiomegaly. 2. Slight elevation left hemidiaphragm versus small subpulmonic left pleural effusion. Electronically Signed   By: Marcello Moores  Register   On: 06/15/2017 15:15

## 2017-06-15 NOTE — ED Notes (Signed)
Helped patient to bathroom. 

## 2017-06-15 NOTE — ED Provider Notes (Signed)
[facility] Emergency Department Provider Note  ____________________________________________   First MD Initiated Contact with Patient 06/15/17 1454     (approximate)  I have reviewed the triage vital signs and the nursing notes.   HISTORY  Chief Complaint Leg Swelling   HPI Angela Yates is a 71 y.o. female who comes to the emergency department via EMS after being sent by medicine urgent care for bilateral lower extremity swelling and shortness of breath. The patient has a known history of hypertension but no cardiac history. She went to urgent care today because she's had for the past 2 days or so she's had increasing nonexertional shortness of breathand bilateral lower extremity swelling. She sleeps on 2 pillows and this is chronic. She does not wake at night short of breath. She has no hemoptysis. No leg swelling is bilateral. She's had no recent surgery or travel. She does have mild sharp chest pain that is nonexertional. Nothing in particular seems to make it come or go.   Past Medical History:  Diagnosis Date  . Hypertension     Patient Active Problem List   Diagnosis Date Noted  . Hypertension, essential 03/14/2015  . Vitamin D deficiency 03/14/2015  . 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 Formication 03/14/2015  . Poor historian 03/14/2015    History reviewed. No pertinent surgical history.  Prior to Admission medications   Medication Sig Start Date End Date Taking? Authorizing Provider  amLODipine (NORVASC) 5 MG tablet Take 1 tablet by mouth daily. 11/05/15  Yes [provider]  hydrochlorothiazide (HYDRODIURIL) 25 MG tablet Take 25 mg by mouth daily.   Yes [provider]  lisinopril (PRINIVIL,ZESTRIL) 10 MG tablet Take 1 tablet by mouth daily. 12/26/15  Yes [provider]  omeprazole (PRILOSEC) 20 MG capsule Take 20 mg by mouth daily.   Yes [provider]    potassium chloride (MICRO-K) 10 MEQ CR capsule Take 1 capsule by mouth daily.  12/29/15  Yes [provider]  TRAVATAN Z 0.004 % SOLN ophthalmic solution Place 1 drop into both eyes daily. 12/29/15  Yes [provider]  vitamin C (ASCORBIC ACID) 500 MG tablet Take 500 mg by mouth daily.   Yes [provider]  acetaminophen (TYLENOL) 500 MG tablet Take 500 mg by mouth every 6 (six) hours as needed.    [provider]  traMADol (ULTRAM) 50 MG tablet Take 1 tablet (50 mg total) by mouth every 12 (twelve) hours as needed for severe pain. 04/15/17   Sable Feil, PA-C    Allergies Patient has no known allergies.  History reviewed. No pertinent family history.  Social History Social History  Substance Use Topics  . Smoking status: Current Every Day Smoker    Packs/day: 0.50    Types: Cigarettes  . Smokeless tobacco: Never Used  . Alcohol use Yes     Comment: occasionally    Review of Systems Constitutional: No fever/chills Eyes: No visual changes. ENT: No sore throat. Cardiovascular: Positive chest pain. Respiratory: Positive shortness of breath. Gastrointestinal: No abdominal pain.  No nausea, no vomiting.  No diarrhea.  No constipation. Genitourinary: Negative for dysuria. Musculoskeletal: Negative for back pain. Skin: Negative for rash. Neurological: Negative for headaches, focal weakness or numbness.   ____________________________________________   PHYSICAL EXAM:  VITAL SIGNS: ED Triage Vitals [06/15/17 1443]  Enc Vitals Group     BP (!) 141/97     Pulse Rate 90  Resp (!) 25     Temp 98.5 F (36.9 C)     Temp Source Oral     SpO2 99 %     Weight 161 lb (73 kg)     Height 5\' 8"  (1.727 m)     Head Circumference      Peak Flow      Pain Score      Pain Loc      Pain Edu?      Excl. in [place]?     Constitutional: Alert and oriented 4 pleasant cooperative speaks in full clear sentences no diaphoresis Eyes: PERRL EOMI. Head:  Atraumatic. Nose: No congestion/rhinnorhea. Mouth/Throat: No trismus Neck: No stridor.  Able to lie completely flat with no jugular venous distention appreciated Cardiovascular: Normal rate, regular rhythm. Grossly normal heart sounds.  Good peripheral circulation. Respiratory: Slightly increased respiratory effort.  No retractions. Lungs CTAB and moving good air Gastrointestinal: Soft nontender Musculoskeletal: No lower extremity edema  legs are equal in size Neurologic:  Normal speech and language. No gross focal neurologic deficits are appreciated. Skin:  Skin is warm, dry and intact. No rash noted. Psychiatric: Mood and affect are normal. Speech and behavior are normal.    ____________________________________________   DIFFERENTIAL includes but not limited to  Fluid overload, congestive heart failure, pulmonary [place], pneumonia, pneumothorax, anemia ____________________________________________   LABS (all labs ordered are listed, but only abnormal results are displayed)  Labs Reviewed  BASIC METABOLIC PANEL - Abnormal; Notable for the following:       Result Value   Potassium 3.3 (*)    Chloride 100 (*)    GFR calc non Af Amer 55 (*)    All other components within normal limits  HEPATIC FUNCTION PANEL - Abnormal; Notable for the following:    Total Protein 8.8 (*)    Bilirubin, Direct <0.1 (*)    All other components within normal limits  CBC WITH DIFFERENTIAL/PLATELET - Abnormal; Notable for the following:    Hemoglobin 11.8 (*)    HCT 34.8 (*)    All other components within normal limits  FIBRIN DERIVATIVES D-DIMER ([facility] ONLY) - Abnormal; Notable for the following:    Fibrin derivatives D-dimer ([facility]) >7,500.00 (*)    All other components within normal limits  TROPONIN I  BRAIN NATRIURETIC PEPTIDE  TSH    Elevated d-dimer concerning for pulmonary embolism normal BNP __________________________________________  EKG  ED ECG REPORT I, Darel Hong, the  attending physician, personally viewed and interpreted this ECG.  Date: 06/15/2017 EKG Time: 1319 Rate: 120 Rhythm: Sinus tachycardia QRS Axis: normal Intervals: normal ST/T Wave abnormalities: normal Narrative Interpretation: Borderline left ventricular hypertrophy with no signs of acute ischemia  ____________________________________________  RADIOLOGY  Chest x-ray shows no fluid overload CT scan of the chest shows no pulmonary embolus ____________________________________________   PROCEDURES  Procedure(s) performed: no  Procedures  Critical Care performed: no  Observation: no ____________________________________________   INITIAL IMPRESSION / ASSESSMENT AND PLAN / ED COURSE  Pertinent labs & imaging results that were available during my care of the patient were reviewed by me and considered in my medical decision making (see chart for details).  When I arrived in the patient's room her heart rate was in the 80s she was breathing comfortably and she had no appreciable edema. She can also lie completely flat with no jugular venous distention. I appreciate her previous providers concern and she very well may have new onset congestive heart failure although at this point  it is not significant enough to require IV nor nitroglycerin. Labs and x-ray are pending.     ----------------------------------------- 6:10 PM on 06/15/2017 -----------------------------------------  Fortunately the patient's workup is unremarkable. She has no lower extremity edema and she is saturating 99% on room air. Her BNP is negative and her CT angio which I obtained secondary to an elevated d-dimer is also negative. Her tachycardia is resolved and she feels improved. At this point with a negative CT angios of the chest normal BNP and normal troponin I don't have a clear etiology of her shortness of breath but she has no emergent conditions requiring inpatient admission. We'll refer her back to  primary care for continued workup. ____________________________________________   FINAL CLINICAL IMPRESSION(S) / ED DIAGNOSES  Final diagnoses:  Shortness of breath      NEW MEDICATIONS STARTED DURING THIS VISIT:  Discharge Medication List as of 06/15/2017  6:10 PM       Note:  This document was prepared using Dragon voice recognition software and may include unintentional dictation errors.     Darel Hong, MD 06/16/17 1410

## 2017-08-01 ENCOUNTER — Emergency Department
Admission: EM | Admit: 2017-08-01 | Discharge: 2017-08-01 | Disposition: A | Discharge status: Home / Self Care | Payer: Medicare Other | Attending: Emergency Medicine | Admitting: Emergency Medicine

## 2017-08-01 DIAGNOSIS — I10 Essential (primary) hypertension: Secondary | ICD-10-CM | POA: Insufficient documentation

## 2017-08-01 DIAGNOSIS — R52 Pain, unspecified: Secondary | ICD-10-CM | POA: Insufficient documentation

## 2017-08-01 DIAGNOSIS — F17210 Nicotine dependence, cigarettes, uncomplicated: Secondary | ICD-10-CM | POA: Insufficient documentation

## 2017-08-01 DIAGNOSIS — Z79899 Other long term (current) drug therapy: Secondary | ICD-10-CM | POA: Diagnosis not present

## 2017-08-01 HISTORY — DX: Nontoxic goiter, unspecified: E04.9

## 2017-08-01 LAB — COMPREHENSIVE METABOLIC PANEL
ALT: 19 U/L (ref 14–54)
AST: 25 U/L (ref 15–41)
Albumin: 4.3 g/dL (ref 3.5–5.0)
Alkaline Phosphatase: 59 U/L (ref 38–126)
Anion gap: 11 (ref 5–15)
BUN: 10 mg/dL (ref 6–20)
CHLORIDE: 99 mmol/L — AB (ref 101–111)
CO2: 26 mmol/L (ref 22–32)
Calcium: 9.3 mg/dL (ref 8.9–10.3)
Creatinine, Ser: 0.69 mg/dL (ref 0.44–1.00)
GFR calc Af Amer: >60 mL/min (ref >60)
Glucose, Bld: 115 mg/dL — ABNORMAL HIGH (ref 65–99)
POTASSIUM: 3.3 mmol/L — AB (ref 3.5–5.1)
Sodium: 136 mmol/L (ref 135–145)
Total Bilirubin: 0.4 mg/dL (ref 0.3–1.2)
Total Protein: 8.9 g/dL — ABNORMAL HIGH (ref 6.5–8.1)

## 2017-08-01 LAB — LIPASE, BLOOD: LIPASE: 34 U/L (ref 11–51)

## 2017-08-01 LAB — CBC WITH DIFFERENTIAL/PLATELET
Basophils Absolute: 0 10*3/uL (ref 0–0.1)
Basophils Relative: 1 %
EOS PCT: 3 %
Eosinophils Absolute: 0.2 10*3/uL (ref 0–0.7)
HCT: 36.7 % (ref 35.0–47.0)
Hemoglobin: 12.5 g/dL (ref 12.0–16.0)
LYMPHS PCT: 22 %
Lymphs Abs: 1.4 10*3/uL (ref 1.0–3.6)
MCH: 28.5 pg (ref 26.0–34.0)
MCHC: 34.1 g/dL (ref 32.0–36.0)
MCV: 83.7 fL (ref 80.0–100.0)
MONO ABS: 0.5 10*3/uL (ref 0.2–0.9)
Monocytes Relative: 9 %
Neutro Abs: 4.2 10*3/uL (ref 1.4–6.5)
Neutrophils Relative %: 65 %
PLATELETS: 304 10*3/uL (ref 150–440)
RBC: 4.39 MIL/uL (ref 3.80–5.20)
RDW: 13.5 % (ref 11.5–14.5)
WBC: 6.3 10*3/uL (ref 3.6–11.0)

## 2017-08-01 LAB — URINALYSIS, COMPLETE (UACMP) WITH MICROSCOPIC
BACTERIA UA: NONE SEEN
Bilirubin Urine: NEGATIVE
Glucose, UA: NEGATIVE mg/dL
KETONES UR: NEGATIVE mg/dL
Leukocytes, UA: NEGATIVE
Nitrite: NEGATIVE
PROTEIN: NEGATIVE mg/dL
Specific Gravity, Urine: 1.002 — ABNORMAL LOW (ref 1.005–1.030)
pH: 6 (ref 5.0–8.0)

## 2017-08-01 LAB — CK: CK TOTAL: 210 U/L (ref 38–234)

## 2017-08-01 LAB — TROPONIN I: Troponin I: <0.03 ng/mL (ref <0.03)

## 2017-08-01 MED ORDER — SODIUM CHLORIDE 0.9 % IV BOLUS (SEPSIS)
500.0000 mL | Freq: Once | INTRAVENOUS | Status: AC
  Administered 2017-08-01: 500 mL via INTRAVENOUS

## 2017-08-01 MED ORDER — TRAMADOL HCL 50 MG PO TABS: 50.0000 mg | ORAL_TABLET | Freq: Four times a day (QID) | ORAL | 0 refills | Status: AC | PRN

## 2017-08-01 MED ORDER — KETOROLAC TROMETHAMINE 30 MG/ML IJ SOLN
10.0000 mg | Freq: Once | INTRAMUSCULAR | Status: AC
  Administered 2017-08-01: 9.9 mg via INTRAVENOUS
  Filled 2017-08-01: qty 1

## 2017-08-01 MED ORDER — ACETAMINOPHEN 500 MG PO TABS
1000.0000 mg | ORAL_TABLET | Freq: Once | ORAL | Status: AC
  Administered 2017-08-01: 1000 mg via ORAL
  Filled 2017-08-01: qty 2

## 2017-08-01 MED ORDER — TRAMADOL HCL 50 MG PO TABS
50.0000 mg | ORAL_TABLET | Freq: Once | ORAL | Status: AC
  Administered 2017-08-01: 50 mg via ORAL
  Filled 2017-08-01: qty 1

## 2017-08-01 MED ORDER — POTASSIUM CHLORIDE CRYS ER 20 MEQ PO TBCR
40.0000 meq | EXTENDED_RELEASE_TABLET | Freq: Once | ORAL | Status: AC
  Administered 2017-08-01: 40 meq via ORAL
  Filled 2017-08-01: qty 2

## 2017-08-01 NOTE — ED Notes (Signed)
Patient taken from triage to room 26. Vitals to be done in room.

## 2017-08-01 NOTE — ED Triage Notes (Signed)
Patient to ED for pain all over. States she cleaned a lot yesterday and was getting ready for labor day. Denies picking up something too heavy but states she is just achy in her back and legs.

## 2017-08-01 NOTE — ED Provider Notes (Signed)
[facility] Emergency Department Provider Note   ____________________________________________   First MD Initiated Contact with Patient 08/01/17 [phone]     (approximate)  I have reviewed the triage vital signs and the nursing notes.   HISTORY  Chief Complaint Back Pain and Leg Pain    HPI Angela Yates is a 71 y.o. female who presents to the ED from home with a chief complaint of "pain all over". States she overexerted herself cleaning her house yesterday in preparation for the Labor Day holiday today.denies fall or heavy lifting but states she "clean the whole house"; sweeping, mopping, lots of bending over. Complains of back pain, chest pain, abdominal soreness, leg pain and cramping in her toes. Denies fever, chills, headache, vision changes, neck pain, shortness of breath, nausea, vomiting, dysuria, diarrhea. Awoke this morning feeling sore all over. Has not taking any medicines prior to arrival. Denies recent travel or trauma. Nothing makes her symptoms better; movement makes her symptoms worse.   Past Medical History:  Diagnosis Date  . Goiter   . Hypertension     Patient Active Problem List   Diagnosis Date Noted  . Hypertension, essential 03/14/2015  . Vitamin D deficiency 03/14/2015  . 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 Formication 03/14/2015  . Poor historian 03/14/2015    History reviewed. No pertinent surgical history.  Prior to Admission medications   Medication Sig Start Date End Date Taking? Authorizing Provider  acetaminophen (TYLENOL) 500 MG tablet Take 500 mg by mouth every 6 (six) hours as needed.    [provider]  amLODipine (NORVASC) 5 MG tablet Take 1 tablet by mouth daily. 11/05/15   [provider]  hydrochlorothiazide (HYDRODIURIL) 25 MG tablet Take 25 mg by mouth daily.    [provider]  lisinopril (PRINIVIL,ZESTRIL) 10 MG tablet Take 1 tablet by  mouth daily. 12/26/15   [provider]  omeprazole (PRILOSEC) 20 MG capsule Take 20 mg by mouth daily.    [provider]  potassium chloride (MICRO-K) 10 MEQ CR capsule Take 1 capsule by mouth daily.  12/29/15   [provider]  traMADol (ULTRAM) 50 MG tablet Take 1 tablet (50 mg total) by mouth every 12 (twelve) hours as needed for severe pain. 04/15/17   Sable Feil, PA-C  TRAVATAN Z 0.004 % SOLN ophthalmic solution Place 1 drop into both eyes daily. 12/29/15   [provider]  vitamin C (ASCORBIC ACID) 500 MG tablet Take 500 mg by mouth daily.    [provider]    Allergies Patient has no known allergies.  No family history on file.  Social History Social History  Substance Use Topics  . Smoking status: Current Every Day Smoker    Packs/day: 0.50    Types: Cigarettes  . Smokeless tobacco: Never Used  . Alcohol use Yes     Comment: occasionally    Review of Systems  Constitutional: No fever/chills. Eyes: No visual changes. ENT: No sore throat. Cardiovascular: positive for chest wall pain. Respiratory: Denies shortness of breath. Gastrointestinal: positive for abdominal soreness.  No nausea, no vomiting.  No diarrhea.  No constipation. Genitourinary: Negative for dysuria. Musculoskeletal: positive for low back pain and leg cramps. Skin: Negative for rash. Neurological: Negative for headaches, focal weakness or numbness.   ____________________________________________   PHYSICAL EXAM:  VITAL SIGNS: ED Triage Vitals  Enc Vitals Group     BP 08/01/17 0447 (!) 170/91  Pulse Rate 08/01/17 0447 100     Resp 08/01/17 0447 18     Temp 08/01/17 0447 98.7 F (37.1 C)     Temp Source 08/01/17 0447 Oral     SpO2 08/01/17 0447 99 %     Weight 08/01/17 0437 160 lb (72.6 kg)     Height 08/01/17 0437 5\' 8"  (1.727 m)     Head Circumference --      Peak Flow --      Pain Score 08/01/17 0436 10     Pain Loc --      Pain Edu?  --      Excl. in [place]? --     Constitutional: Alert and oriented. Well appearing and in no acute distress. Eyes: Conjunctivae are normal. PERRL. EOMI. Head: Atraumatic. Nose: No congestion/rhinnorhea. Mouth/Throat: Mucous membranes are moist.  Oropharynx non-erythematous. Neck: No stridor.  No cervical spine tenderness to palpation. Cardiovascular: Normal rate, regular rhythm. Grossly normal heart sounds.  Good peripheral circulation. Respiratory: Normal respiratory effort.  No retractions. Lungs CTAB. anterior chest wall tender to palpation. Gastrointestinal: Soft and nontender to light or deep palpation. No distention. No abdominal bruits. No CVA tenderness. Musculoskeletal: no spinal tenderness to palpation. Bilateral lumbar spinal mild muscle spasms. bilateral lower extremity soreness. No pedal edema.  No joint effusions. Neurologic:  Normal speech and language. No gross focal neurologic deficits are appreciated. No gait instability. seen to ambulate to treatment room with steady gait. Skin:  Skin is warm, dry and intact. No rash noted. Psychiatric: Mood and affect are normal. Speech and behavior are normal.  ____________________________________________   LABS (all labs ordered are listed, but only abnormal results are displayed)  Labs Reviewed  COMPREHENSIVE METABOLIC PANEL - Abnormal; Notable for the following:       Result Value   Potassium 3.3 (*)    Chloride 99 (*)    Glucose, Bld 115 (*)    Total Protein 8.9 (*)    All other components within normal limits  CBC WITH DIFFERENTIAL/PLATELET  LIPASE, BLOOD  TROPONIN I  CK  URINALYSIS, COMPLETE (UACMP) WITH MICROSCOPIC   ____________________________________________  EKG  ED ECG REPORT I, Shamicka Inga J, the attending physician, personally viewed and interpreted this ECG.   Date: 08/01/2017  EKG Time: 0559  Rate: 90  Rhythm: normal EKG, normal sinus rhythm  Axis: normal  Intervals:none  ST&T Change:  nonspecific  ____________________________________________  RADIOLOGY  No results found.  ____________________________________________   PROCEDURES  Procedure(s) performed: None  Procedures  Critical Care performed: No  ____________________________________________   INITIAL IMPRESSION / ASSESSMENT AND PLAN / ED COURSE  Pertinent labs & imaging results that were available during my care of the patient were reviewed by me and considered in my medical decision making (see chart for details).  71 year old female who presents with generalized pain after cleaning her house. Will check basic lab work, urinalysis; administer nonopiate medications as patient drove herself, and reassess.  Clinical Course as of Aug 02 655  Mon Aug 01, 2017  0603 Urine specimen obtained contaminated with stool as patient had to have a BM. Laboratory results noted. Will replete potassium, initiate IV fluid resuscitation. Awaiting urine sample.  [JS]  0653 Pain improved to 5/10. Awaiting urine sample. Anticipate patient will be able to be discharged home pending results of urinalysis. Will discharge home on tramadol and she will follow up closely with her PCP. Care transferred to oncoming provider.  [JS]    Clinical Course User Index [JS] Beather Arbour,  Gretchen Short, MD     ____________________________________________   FINAL CLINICAL IMPRESSION(S) / ED DIAGNOSES  Final diagnoses:  Generalized pain      NEW MEDICATIONS STARTED DURING THIS VISIT:  New Prescriptions   No medications on file     Note:  This document was prepared using Dragon voice recognition software and may include unintentional dictation errors.    Paulette Blanch, MD 08/01/17 [phone]

## 2017-08-01 NOTE — ED Notes (Signed)
Assisted pt up to restroom. Ambulated well; pt unable to separate urine and BM so no urine specimen at this time.

## 2017-08-01 NOTE — Discharge Instructions (Signed)
You may take ibuprofen and Tylenol as needed for pain. You may take tramadol as needed for more severe pain. Rest and relax indoors this holiday weekend. Return to the ER for worsening symptoms, persistent vomiting, difficulty breathing or other concerns.

## 2017-08-01 NOTE — ED Notes (Signed)
Re-do of departure condition d/t questions for MD. Pt thought she was supposed to have RX for infection. After speaking with Dr. Archie Balboa, pt no longer has question and is no longer concerned about the antibiotic.

## 2017-10-27 ENCOUNTER — Encounter: Payer: Self-pay | Admitting: *Deleted

## 2017-10-28 ENCOUNTER — Ambulatory Visit: Payer: 59 | Admitting: Anesthesiology

## 2017-10-28 ENCOUNTER — Encounter
Admission: RE | Disposition: A | Discharge status: Home / Self Care | Payer: Self-pay | Source: Ambulatory Visit | Attending: Gastroenterology

## 2017-10-28 ENCOUNTER — Encounter: Payer: Self-pay | Admitting: *Deleted

## 2017-10-28 ENCOUNTER — Ambulatory Visit
Admission: RE | Admit: 2017-10-28 | Discharge: 2017-10-28 | Disposition: A | Discharge status: Home / Self Care | Payer: 59 | Source: Ambulatory Visit | Attending: Gastroenterology | Admitting: Gastroenterology

## 2017-10-28 DIAGNOSIS — Z538 Procedure and treatment not carried out for other reasons: Secondary | ICD-10-CM | POA: Insufficient documentation

## 2017-10-28 HISTORY — DX: Other constipation: K59.09

## 2017-10-28 HISTORY — DX: Anemia, unspecified: D64.9

## 2017-10-28 HISTORY — DX: Tobacco use: Z72.0

## 2017-10-28 HISTORY — DX: Inflammatory liver disease, unspecified: K75.9

## 2017-10-28 HISTORY — DX: Noninfective gastroenteritis and colitis, unspecified: K52.9

## 2017-10-28 SURGERY — COLONOSCOPY WITH PROPOFOL
Anesthesia: General

## 2017-10-28 MED ORDER — SODIUM CHLORIDE 0.9 % IV SOLN: INTRAVENOUS | Status: DC

## 2017-10-28 NOTE — H&P (Signed)
Patient presented for colonoscopy today. However impression the patient she states she was still having some solid stools. Rectal examination showed that she indeed had solid stool in the vault. We will help her we'll arrange to be given a repeat prep and rescheduled.

## 2017-10-28 NOTE — Anesthesia Preprocedure Evaluation (Deleted)
Anesthesia Evaluation  Patient identified by MRN, date of birth, ID band Patient awake    Reviewed: Allergy & Precautions, NPO status , Patient's Chart, lab work & pertinent test results  History of Anesthesia Complications Negative for: history of anesthetic complications  Airway Mallampati: II       Dental  (+) Chipped, Missing, Poor Dentition   Pulmonary neg sleep apnea, neg COPD, Current Smoker,           Cardiovascular hypertension, Pt. on medications (-) Past MI and (-) CHF (-) dysrhythmias (-) Valvular Problems/Murmurs     Neuro/Psych neg Seizures    GI/Hepatic GERD  Medicated and Controlled,(+) Hepatitis -  Endo/Other  neg diabetes  Renal/GU negative Renal ROS     Musculoskeletal   Abdominal   Peds  Hematology  (+) anemia ,   Anesthesia Other Findings   Reproductive/Obstetrics                            Anesthesia Physical Anesthesia Plan  ASA: III  Anesthesia Plan: General   Post-op Pain Management:    Induction: Intravenous  PONV Risk Score and Plan: 2 and TIVA and Propofol infusion  Airway Management Planned: Nasal Cannula  Additional Equipment:   Intra-op Plan:   Post-operative Plan:   Informed Consent: I have reviewed the patients History and Physical, chart, labs and discussed the procedure including the risks, benefits and alternatives for the proposed anesthesia with the patient or authorized representative who has indicated his/her understanding and acceptance.     Plan Discussed with:   Anesthesia Plan Comments:         Anesthesia Quick Evaluation

## 2017-10-28 NOTE — Consult Note (Signed)
Procedure cancelled because pt was not cleaned out.

## 2018-01-26 ENCOUNTER — Encounter: Payer: Self-pay | Admitting: Emergency Medicine

## 2018-01-27 ENCOUNTER — Encounter
Admission: RE | Disposition: A | Discharge status: Home / Self Care | Payer: Self-pay | Source: Ambulatory Visit | Attending: Gastroenterology

## 2018-01-27 ENCOUNTER — Ambulatory Visit
Admission: RE | Admit: 2018-01-27 | Discharge: 2018-01-27 | Disposition: A | Discharge status: Home / Self Care | Payer: 59 | Source: Ambulatory Visit | Attending: Gastroenterology | Admitting: Gastroenterology

## 2018-01-27 ENCOUNTER — Encounter: Payer: Self-pay | Admitting: *Deleted

## 2018-01-27 ENCOUNTER — Ambulatory Visit: Payer: 59 | Admitting: Anesthesiology

## 2018-01-27 DIAGNOSIS — Z86010 Personal history of colonic polyps: Secondary | ICD-10-CM | POA: Diagnosis not present

## 2018-01-27 DIAGNOSIS — K5909 Other constipation: Secondary | ICD-10-CM | POA: Insufficient documentation

## 2018-01-27 DIAGNOSIS — K2970 Gastritis, unspecified, without bleeding: Secondary | ICD-10-CM | POA: Insufficient documentation

## 2018-01-27 DIAGNOSIS — R634 Abnormal weight loss: Secondary | ICD-10-CM | POA: Diagnosis not present

## 2018-01-27 DIAGNOSIS — Z79899 Other long term (current) drug therapy: Secondary | ICD-10-CM | POA: Diagnosis not present

## 2018-01-27 DIAGNOSIS — Z6822 Body mass index (BMI) 22.0-22.9, adult: Secondary | ICD-10-CM | POA: Insufficient documentation

## 2018-01-27 DIAGNOSIS — E785 Hyperlipidemia, unspecified: Secondary | ICD-10-CM | POA: Diagnosis not present

## 2018-01-27 DIAGNOSIS — K5900 Constipation, unspecified: Secondary | ICD-10-CM | POA: Diagnosis present

## 2018-01-27 DIAGNOSIS — D127 Benign neoplasm of rectosigmoid junction: Secondary | ICD-10-CM | POA: Diagnosis not present

## 2018-01-27 DIAGNOSIS — K449 Diaphragmatic hernia without obstruction or gangrene: Secondary | ICD-10-CM | POA: Insufficient documentation

## 2018-01-27 DIAGNOSIS — Z8619 Personal history of other infectious and parasitic diseases: Secondary | ICD-10-CM | POA: Insufficient documentation

## 2018-01-27 DIAGNOSIS — Q438 Other specified congenital malformations of intestine: Secondary | ICD-10-CM | POA: Insufficient documentation

## 2018-01-27 DIAGNOSIS — I10 Essential (primary) hypertension: Secondary | ICD-10-CM | POA: Insufficient documentation

## 2018-01-27 DIAGNOSIS — K210 Gastro-esophageal reflux disease with esophagitis: Secondary | ICD-10-CM | POA: Diagnosis not present

## 2018-01-27 HISTORY — PX: ESOPHAGOGASTRODUODENOSCOPY (EGD) WITH PROPOFOL: SHX5813

## 2018-01-27 HISTORY — PX: COLONOSCOPY WITH PROPOFOL: SHX5780

## 2018-01-27 HISTORY — DX: Calculus of gallbladder without cholecystitis without obstruction: K80.20

## 2018-01-27 HISTORY — DX: Polyp of colon: K63.5

## 2018-01-27 HISTORY — DX: Hyperlipidemia, unspecified: E78.5

## 2018-01-27 SURGERY — COLONOSCOPY WITH PROPOFOL
Anesthesia: General

## 2018-01-27 MED ORDER — PHENYLEPHRINE HCL 10 MG/ML IJ SOLN
INTRAMUSCULAR | Status: DC | PRN
  Administered 2018-01-27: 100 ug via INTRAVENOUS
  Administered 2018-01-27: 200 ug via INTRAVENOUS
  Administered 2018-01-27: 100 ug via INTRAVENOUS

## 2018-01-27 MED ORDER — MIDAZOLAM HCL 2 MG/2ML IJ SOLN
INTRAMUSCULAR | Status: DC | PRN
  Administered 2018-01-27: 0.5 mg via INTRAVENOUS

## 2018-01-27 MED ORDER — PROPOFOL 10 MG/ML IV BOLUS
INTRAVENOUS | Status: AC
  Filled 2018-01-27: qty 20

## 2018-01-27 MED ORDER — SODIUM CHLORIDE 0.9 % IV SOLN
INTRAVENOUS | Status: DC
  Administered 2018-01-27 (×2): via INTRAVENOUS

## 2018-01-27 MED ORDER — PROPOFOL 500 MG/50ML IV EMUL
INTRAVENOUS | Status: DC | PRN
  Administered 2018-01-27: 140 ug/kg/min via INTRAVENOUS

## 2018-01-27 MED ORDER — FENTANYL CITRATE (PF) 100 MCG/2ML IJ SOLN
INTRAMUSCULAR | Status: DC | PRN
  Administered 2018-01-27: 50 ug via INTRAVENOUS

## 2018-01-27 MED ORDER — LIDOCAINE HCL (CARDIAC) 20 MG/ML IV SOLN
INTRAVENOUS | Status: DC | PRN
  Administered 2018-01-27: 60 mg via INTRAVENOUS

## 2018-01-27 MED ORDER — MIDAZOLAM HCL 2 MG/2ML IJ SOLN
INTRAMUSCULAR | Status: AC
Start: 2018-01-27 — End: 2018-01-27
  Filled 2018-01-27: qty 2

## 2018-01-27 MED ORDER — EPHEDRINE SULFATE 50 MG/ML IJ SOLN
INTRAMUSCULAR | Status: DC | PRN
  Administered 2018-01-27: 10 mg via INTRAVENOUS

## 2018-01-27 MED ORDER — FENTANYL CITRATE (PF) 100 MCG/2ML IJ SOLN
INTRAMUSCULAR | Status: AC
  Filled 2018-01-27: qty 2

## 2018-01-27 MED ORDER — SODIUM CHLORIDE 0.9 % IV SOLN: INTRAVENOUS | Status: DC

## 2018-01-27 MED ORDER — PROPOFOL 10 MG/ML IV BOLUS
INTRAVENOUS | Status: DC | PRN
  Administered 2018-01-27: 40 mg via INTRAVENOUS
  Administered 2018-01-27: 10 mg via INTRAVENOUS

## 2018-01-27 NOTE — Anesthesia Procedure Notes (Signed)
Date/Time: 01/27/2018 8:30 AM Performed by: Allean Found, CRNA Pre-anesthesia Checklist: Patient identified, Emergency Drugs available, Suction available, Patient being monitored and Timeout performed Patient Re-evaluated:Patient Re-evaluated prior to induction Oxygen Delivery Method: Nasal cannula Placement Confirmation: positive ETCO2

## 2018-01-27 NOTE — Anesthesia Preprocedure Evaluation (Signed)
Anesthesia Evaluation  Patient identified by MRN, date of birth, ID band Patient awake    Reviewed: Allergy & Precautions, H&P , NPO status , Patient's Chart, lab work & pertinent test results, reviewed documented beta blocker date and time   Airway Mallampati: II   Neck ROM: full    Dental  (+) Poor Dentition, Missing, Loose   Pulmonary neg pulmonary ROS, Current Smoker,    Pulmonary exam normal        Cardiovascular Exercise Tolerance: Poor hypertension, On Medications negative cardio ROS Normal cardiovascular exam Rhythm:regular Rate:Normal     Neuro/Psych negative neurological ROS  negative psych ROS   GI/Hepatic negative GI ROS, Neg liver ROS, (+) Hepatitis -  Endo/Other  negative endocrine ROS  Renal/GU negative Renal ROS  negative genitourinary   Musculoskeletal   Abdominal   Peds  Hematology negative hematology ROS (+) anemia ,   Anesthesia Other Findings Past Medical History: No date: Anemia No date: Chronic constipation No date: Chronic diarrhea No date: Colon polyp No date: Gallstones No date: Goiter No date: Hepatitis     Comment:  Hep C No date: Hyperlipidemia No date: Hypertension No date: Tobacco abuse Past Surgical History: No date: COLON SURGERY No date: LIVER BIOPSY BMI    Body Mass Index:  22.81 kg/m     Reproductive/Obstetrics negative OB ROS                             Anesthesia Physical Anesthesia Plan  ASA: III  Anesthesia Plan: General   Post-op Pain Management:    Induction:   PONV Risk Score and Plan:   Airway Management Planned:   Additional Equipment:   Intra-op Plan:   Post-operative Plan:   Informed Consent: I have reviewed the patients History and Physical, chart, labs and discussed the procedure including the risks, benefits and alternatives for the proposed anesthesia with the patient or authorized representative who has  indicated his/her understanding and acceptance.   Dental Advisory Given  Plan Discussed with: CRNA  Anesthesia Plan Comments:         Anesthesia Quick Evaluation

## 2018-01-27 NOTE — Anesthesia Postprocedure Evaluation (Signed)
Anesthesia Post Note  Patient: Angela Yates  Procedure(s) Performed: COLONOSCOPY WITH PROPOFOL (N/A ) ESOPHAGOGASTRODUODENOSCOPY (EGD) WITH PROPOFOL (N/A )  Patient location during evaluation: PACU Anesthesia Type: General Level of consciousness: awake and alert Pain management: pain level controlled Vital Signs Assessment: post-procedure vital signs reviewed and stable Respiratory status: spontaneous breathing, nonlabored ventilation, respiratory function stable and patient connected to nasal cannula oxygen Cardiovascular status: blood pressure returned to baseline and stable Postop Assessment: no apparent nausea or vomiting Anesthetic complications: no     Last Vitals:  Vitals:   01/27/18 0727  BP: 134/75  Pulse: 98  Resp: 18  Temp: (!) 36 C  SpO2: 100%    Last Pain:  Vitals:   01/27/18 0727  TempSrc: Tympanic                 Molli Barrows

## 2018-01-27 NOTE — Anesthesia Procedure Notes (Signed)
Date/Time: 01/27/2018 8:50 AM Performed by: Allean Found, CRNA Pre-anesthesia Checklist: Patient identified, Emergency Drugs available, Suction available, Patient being monitored and Timeout performed Patient Re-evaluated:Patient Re-evaluated prior to induction Oxygen Delivery Method: Nasal cannula Placement Confirmation: positive ETCO2 Dental Injury: Teeth and Oropharynx as per pre-operative assessment

## 2018-01-27 NOTE — H&P (Signed)
Outpatient short stay form Pre-procedure 01/27/2018 8:29 AM Lollie Sails MD  Primary Physician: Dr. Sheilah Mins  Reason for visit: EGD and colonoscopy  History of present illness: Patient is a 72 year old female presenting today as above.  She has a personal history of some weight loss as well as lower abdominal pain.  There is some reflux symptoms as well.  He does relate that her appetite has not been as good lately.  She has had a CT scan of the abdomen that indicated she had a gonadal vein enlargement.  He uses multiple agents to help her have bowel movements.  She tolerated her prep well.  She takes no aspirin or blood thinning agent.    Current Facility-Administered Medications:  .  0.9 %  sodium chloride infusion, , Intravenous, Continuous, Lollie Sails, MD, Last Rate: 20 mL/hr at 01/27/18 0745 .  0.9 %  sodium chloride infusion, , Intravenous, Continuous, Lollie Sails, MD  Medications Prior to Admission  Medication Sig Dispense Refill Last Dose  . acetaminophen (TYLENOL) 500 MG tablet Take 500 mg by mouth every 6 (six) hours as needed.   01/27/2018 at 0100  . amLODipine (NORVASC) 5 MG tablet Take 1 tablet by mouth daily.   01/27/2018 at 0100  . cholecalciferol (VITAMIN D) 400 units TABS tablet Take 1,000 Units by mouth daily.   01/27/2018 at 0100  . fluticasone (FLONASE) 50 MCG/ACT nasal spray Place into both nostrils daily.   01/27/2018 at 0100  . ibuprofen (ADVIL,MOTRIN) 200 MG tablet Take 200 mg by mouth every 6 (six) hours as needed.   01/25/2018  . lubiprostone (AMITIZA) 8 MCG capsule Take 8 mcg by mouth 2 (two) times daily with a meal.   01/27/2018 at 0100  . hydrochlorothiazide (HYDRODIURIL) 25 MG tablet Take 25 mg by mouth daily.   01/27/2018 at 0100  . lisinopril (PRINIVIL,ZESTRIL) 10 MG tablet Take 1 tablet by mouth daily.   01/27/2018 at 0100  . omeprazole (PRILOSEC) 20 MG capsule Take 20 mg by mouth daily.   01/27/2018 at 0100  . potassium chloride (MICRO-K) 10 MEQ CR  capsule Take 1 capsule by mouth daily.    01/27/2018 at 0100  . traMADol (ULTRAM) 50 MG tablet Take 1 tablet (50 mg total) by mouth every 6 (six) hours as needed. 20 tablet 0 01/27/2018 at 0100  . TRAVATAN Z 0.004 % SOLN ophthalmic solution Place 1 drop into both eyes daily.   10/27/2017 at Unknown time  . vitamin C (ASCORBIC ACID) 500 MG tablet Take 500 mg by mouth daily.   Past Week at Unknown time     No Known Allergies   Past Medical History:  Diagnosis Date  . Anemia   . Chronic constipation   . Chronic diarrhea   . Colon polyp   . Gallstones   . Goiter   . Hepatitis    Hep C  . Hyperlipidemia   . Hypertension   . Tobacco abuse     Review of systems:      Physical Exam    Heart and lungs: Regular rate and rhythm without rub or gallop, lungs are bilaterally clear.    HEENT: Normocephalic atraumatic eyes are anicteric    Other:    Pertinant exam for procedure: Soft nontender nondistended bowel sounds positive normoactive    Planned proceedures: EGD, colonoscopy and indicated procedures. I have discussed the risks benefits and complications of procedures to include not limited to bleeding, infection, perforation and the risk of  sedation and the patient wishes to proceed.    Lollie Sails, MD Gastroenterology 01/27/2018  8:29 AM

## 2018-01-27 NOTE — Op Note (Signed)
[facility] Gastroenterology Patient Name: Angela Yates Procedure Date: 01/27/2018 8:23 AM MRN: [id number] Account #: [id number] Date of Birth: 02/13/1946 Admit Type: Outpatient Age: 72 Room: [facility] ENDO ROOM 3 Gender: Female Note Status: Finalized Procedure:            Upper GI endoscopy Indications:          Reflux esophagitis Providers:            Lollie Sails, MD Referring MD:         Perry Mount. Crummett NP, NP (Referring MD) Medicines:            Monitored Anesthesia Care Complications:        No immediate complications. Procedure:            Pre-Anesthesia Assessment:                       - ASA Grade Assessment: III - A patient with severe                        systemic disease.                       After obtaining informed consent, the endoscope was                        passed under direct vision. Throughout the procedure,                        the patient's blood pressure, pulse, and oxygen                        saturations were monitored continuously. The Endoscope                        was introduced through the mouth, and advanced to the                        second part of duodenum. The upper GI endoscopy was                        accomplished without difficulty. The patient tolerated                        the procedure well. Findings:      A small hiatal hernia was present.      The Z-line was variable. Biopsies were taken with a cold forceps for       histology.      Diffuse mild inflammation characterized by erythema was found in the       gastric body. Biopsies were taken with a cold forceps for histology.      Patchy mild inflammation characterized by erosions and erythema was       found in the gastric antrum. Biopsies were taken with a cold forceps for       histology.      The posterior wall of the gastric vault was erythematous, separate       biopsy taken of this area. Localized mildly erythematous mucosa without   bleeding was found on the posterior wall of the gastric body. Biopsies       were taken with a cold forceps for histology.  The examined duodenum was normal. There is a sharp angulation at the       beginning ot the third portion I was unable to go around. Impression:           - Small hiatal hernia.                       - Z-line variable. Biopsied.                       - Gastritis. Biopsied.                       - Gastritis. Biopsied.                       - Erythematous mucosa in the posterior wall of the                        gastric body. Biopsied.                       - Normal examined duodenum. Recommendation:       - Perform a colonoscopy today. Procedure Code(s):    --- Professional ---                       [phone], Esophagogastroduodenoscopy, flexible, transoral;                        with biopsy, single or multiple Diagnosis Code(s):    --- Professional ---                       K44.9, Diaphragmatic hernia without obstruction or                        gangrene                       K22.8, Other specified diseases of esophagus                       K29.70, Gastritis, unspecified, without bleeding                       K31.89, Other diseases of stomach and duodenum                       K21.0, Gastro-esophageal reflux disease with esophagitis CPT copyright 2016 [employer]. All rights reserved. The codes documented in this report are preliminary and upon coder review may  be revised to meet current compliance requirements. Lollie Sails, MD 01/27/2018 8:57:32 AM This report has been signed electronically. Number of Addenda: 0 Note Initiated On: 01/27/2018 8:23 AM      [facility]

## 2018-01-27 NOTE — Op Note (Addendum)
[facility] Gastroenterology Patient Name: Angela Yates Procedure Date: 01/27/2018 8:23 AM MRN: [id number] Account #: [id number] Date of Birth: 03-Jun-1946 Admit Type: Outpatient Age: 73 Room: [facility] ENDO ROOM 3 Gender: Female Note Status: Supervisor Override Procedure:            Colonoscopy Indications:          Constipation, Weight loss Providers:            Lollie Sails, MD Referring MD:         Perry Mount. Crummett NP, NP (Referring MD) Medicines:            Monitored Anesthesia Care Complications:        No immediate complications. Procedure:            Pre-Anesthesia Assessment:                       - ASA Grade Assessment: III - A patient with severe                        systemic disease.                       After obtaining informed consent, the colonoscope was                        passed under direct vision. Throughout the procedure,                        the patient's blood pressure, pulse, and oxygen                        saturations were monitored continuously. The                        Colonoscope was introduced through the anus and                        advanced to the the cecum, identified by appendiceal                        orifice and ileocecal valve. The colonoscopy was                        performed with moderate difficulty due to a tortuous                        colon. Successful completion of the procedure was aided                        by using manual pressure. The patient tolerated the                        procedure well. The quality of the bowel preparation                        was fair. Findings:      The sigmoid colon and descending colon were moderately redundant.      Four sessile polyps were found in the recto-sigmoid colon. The polyps       were 1 to 2 mm in size. These polyps were removed  with a cold biopsy       forceps. Resection and retrieval were complete.      The digital rectal exam findings include The  anal orifice is somewhat       displaced to the right, however there is no other apparent abnormality       in the rectum or on external inspection.      The exam was otherwise without abnormality. Impression:           - Preparation of the colon was fair.                       - Redundant colon.                       - Four 1 to 2 mm polyps at the recto-sigmoid colon.                       - The anal orifice is somewhat displaced to the right,                        however there is no appar found on digital rectal exam.                       - The examination was otherwise normal. Recommendation:       - Discharge patient to home.                       - Return to GI clinic in 1 month. 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 All rights reserved. The codes documented in this report are preliminary and upon coder review may  be revised to meet current compliance requirements. Lollie Sails, MD 01/27/2018 9:23:22 AM This report has been signed electronically. Number of Addenda: 0 Note Initiated On: 01/27/2018 8:23 AM Scope Withdrawal Time: 0 hours 7 minutes 57 seconds  Total Procedure Duration: 0 hours 17 minutes 43 seconds       [facility]

## 2018-01-27 NOTE — Anesthesia Post-op Follow-up Note (Signed)
Anesthesia QCDR form completed.        

## 2018-01-27 NOTE — Transfer of Care (Signed)
Immediate Anesthesia Transfer of Care Note  Patient: Angela Yates  Procedure(s) Performed: COLONOSCOPY WITH PROPOFOL (N/A ) ESOPHAGOGASTRODUODENOSCOPY (EGD) WITH PROPOFOL (N/A )  Patient Location: PACU  Anesthesia Type:General  Level of Consciousness: awake  Airway & Oxygen Therapy: Patient Spontanous Breathing and Patient connected to nasal cannula oxygen  Post-op Assessment: Report given to RN and Post -op Vital signs reviewed and stable  Post vital signs: Reviewed and stable  Last Vitals:  Vitals:   01/27/18 0727 01/27/18 0929  BP: 134/75 118/69  Pulse: 98 82  Resp: 18 16  Temp: (!) 36 C (!) 36.1 C  SpO2: 100% 100%    Last Pain:  Vitals:   01/27/18 0929  TempSrc: Tympanic         Complications: No apparent anesthesia complications

## 2018-01-28 ENCOUNTER — Encounter: Payer: Self-pay | Admitting: Gastroenterology

## 2018-01-30 LAB — SURGICAL PATHOLOGY

## 2018-02-13 ENCOUNTER — Other Ambulatory Visit: Payer: Self-pay | Admitting: Family Medicine

## 2018-02-13 DIAGNOSIS — Z1231 Encounter for screening mammogram for malignant neoplasm of breast: Secondary | ICD-10-CM

## 2018-09-10 ENCOUNTER — Emergency Department: Payer: Medicare Other

## 2018-09-10 ENCOUNTER — Other Ambulatory Visit: Payer: Self-pay

## 2018-09-10 ENCOUNTER — Emergency Department
Admission: EM | Admit: 2018-09-10 | Discharge: 2018-09-10 | Disposition: A | Discharge status: Home / Self Care | Payer: Medicare Other | Attending: Emergency Medicine | Admitting: Emergency Medicine

## 2018-09-10 ENCOUNTER — Encounter: Payer: Self-pay | Admitting: Emergency Medicine

## 2018-09-10 DIAGNOSIS — R1084 Generalized abdominal pain: Secondary | ICD-10-CM | POA: Insufficient documentation

## 2018-09-10 DIAGNOSIS — R0789 Other chest pain: Secondary | ICD-10-CM | POA: Diagnosis not present

## 2018-09-10 DIAGNOSIS — Z79899 Other long term (current) drug therapy: Secondary | ICD-10-CM | POA: Diagnosis not present

## 2018-09-10 DIAGNOSIS — R079 Chest pain, unspecified: Secondary | ICD-10-CM | POA: Diagnosis present

## 2018-09-10 DIAGNOSIS — R112 Nausea with vomiting, unspecified: Secondary | ICD-10-CM | POA: Diagnosis not present

## 2018-09-10 DIAGNOSIS — F17210 Nicotine dependence, cigarettes, uncomplicated: Secondary | ICD-10-CM | POA: Diagnosis not present

## 2018-09-10 DIAGNOSIS — I10 Essential (primary) hypertension: Secondary | ICD-10-CM | POA: Insufficient documentation

## 2018-09-10 LAB — URINALYSIS, COMPLETE (UACMP) WITH MICROSCOPIC
Bacteria, UA: NONE SEEN
Bilirubin Urine: NEGATIVE
GLUCOSE, UA: NEGATIVE mg/dL
Ketones, ur: NEGATIVE mg/dL
Leukocytes, UA: NEGATIVE
Nitrite: NEGATIVE
PROTEIN: NEGATIVE mg/dL
Specific Gravity, Urine: 1.011 (ref 1.005–1.030)
pH: 5 (ref 5.0–8.0)

## 2018-09-10 LAB — CBC
HEMATOCRIT: 33.9 % — AB (ref 36.0–46.0)
HEMOGLOBIN: 11.2 g/dL — AB (ref 12.0–15.0)
MCH: 29.2 pg (ref 26.0–34.0)
MCHC: 33 g/dL (ref 30.0–36.0)
MCV: 88.5 fL (ref 80.0–100.0)
Platelets: 264 10*3/uL (ref 150–400)
RBC: 3.83 MIL/uL — AB (ref 3.87–5.11)
RDW: 15.2 % (ref 11.5–15.5)
WBC: 3.9 10*3/uL — AB (ref 4.0–10.5)
nRBC: 0 % (ref 0.0–0.2)

## 2018-09-10 LAB — LIPASE, BLOOD: Lipase: 48 U/L (ref 11–51)

## 2018-09-10 LAB — BASIC METABOLIC PANEL
ANION GAP: 12 (ref 5–15)
BUN: 18 mg/dL (ref 8–23)
CHLORIDE: 100 mmol/L (ref 98–111)
CO2: 26 mmol/L (ref 22–32)
Calcium: 8.6 mg/dL — ABNORMAL LOW (ref 8.9–10.3)
Creatinine, Ser: 0.84 mg/dL (ref 0.44–1.00)
GFR calc Af Amer: >60 mL/min (ref >60)
GFR calc non Af Amer: >60 mL/min (ref >60)
GLUCOSE: 113 mg/dL — AB (ref 70–99)
POTASSIUM: 3.4 mmol/L — AB (ref 3.5–5.1)
SODIUM: 138 mmol/L (ref 135–145)

## 2018-09-10 LAB — TROPONIN I: Troponin I: <0.03 ng/mL (ref <0.03)

## 2018-09-10 LAB — ETHANOL: ALCOHOL ETHYL (B): 83 mg/dL — AB (ref <10)

## 2018-09-10 LAB — HEPATIC FUNCTION PANEL
ALT: 35 U/L (ref 0–44)
AST: 54 U/L — ABNORMAL HIGH (ref 15–41)
Albumin: 4 g/dL (ref 3.5–5.0)
Alkaline Phosphatase: 58 U/L (ref 38–126)
Bilirubin, Direct: <0.1 mg/dL (ref 0.0–0.2)
TOTAL PROTEIN: 7.4 g/dL (ref 6.5–8.1)
Total Bilirubin: 0.8 mg/dL (ref 0.3–1.2)

## 2018-09-10 NOTE — ED Triage Notes (Signed)
Pain across entire epigastric area for 2-3 days.  + nausea without vomiting. VSS, NAD. Ambulatory. Pt reports she had surgery for worms before and thinks maybe she needs that again.  No diarrhea or fevers.

## 2018-09-10 NOTE — ED Notes (Signed)
When attaching monitors to pt. RN noticed fruity odor to breath. RN asked about hx of diabetes, pt denies any hx. Rn asked about alcohol use, pt states " I had to have some alcohol for the pain because the pain was to bad, I tried tylenol and ibuprofen, and nothing was helping so I had to have a drink".

## 2018-09-10 NOTE — ED Provider Notes (Signed)
[facility] Emergency Department Provider Note  Time seen: 12:40 PM  I have reviewed the triage vital signs and the nursing notes.   HISTORY  Chief Complaint Chest Pain    HPI Angela Yates is a 72 y.o. female with a past medical history of anemia, hepatitis, hypertension, hyperlipidemia, presents to the emergency department for pain.  According to the patient for the past week she has been expensing pain all over her body.  States chest pain abdominal pain back pain and pain in her arms and legs.  States it has been more severe today.  Admits to drinking alcohol to try to help the pain.  Took Tylenol without any relief so she came to the emergency department.  Patient denies any trouble breathing, does state mild nausea and had one episode of vomiting yesterday.  Denies diarrhea.  Denies dysuria.  Describes her pain is moderate to severe located all over her body.  Past Medical History:  Diagnosis Date  . Anemia   . Chronic constipation   . Chronic diarrhea   . Colon polyp   . Gallstones   . Goiter   . Hepatitis    Hep C  . Hyperlipidemia   . Hypertension   . Tobacco abuse     Patient Active Problem List   Diagnosis Date Noted  . Hypertension, essential 03/14/2015  . Vitamin D deficiency 03/14/2015  . 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 Formication 03/14/2015  . Poor historian 03/14/2015    Past Surgical History:  Procedure Laterality Date  . COLON SURGERY    . COLONOSCOPY WITH PROPOFOL N/A 01/27/2018   Procedure: COLONOSCOPY WITH PROPOFOL;  Surgeon: Lollie Sails, MD;  Location: [facility] ENDOSCOPY;  Service: Endoscopy;  Laterality: N/A;  . ESOPHAGOGASTRODUODENOSCOPY (EGD) WITH PROPOFOL N/A 01/27/2018   Procedure: ESOPHAGOGASTRODUODENOSCOPY (EGD) WITH PROPOFOL;  Surgeon: Lollie Sails, MD;  Location: [facility] ENDOSCOPY;  Service: Endoscopy;  Laterality: N/A;  . LIVER BIOPSY      Prior to Admission  medications   Medication Sig Start Date End Date Taking? Authorizing Provider  acetaminophen (TYLENOL) 500 MG tablet Take 500 mg by mouth every 6 (six) hours as needed.    [provider]  amLODipine (NORVASC) 5 MG tablet Take 1 tablet by mouth daily. 11/05/15   [provider]  cholecalciferol (VITAMIN D) 400 units TABS tablet Take 1,000 Units by mouth daily.    [provider]  fluticasone (FLONASE) 50 MCG/ACT nasal spray Place into both nostrils daily.    [provider]  hydrochlorothiazide (HYDRODIURIL) 25 MG tablet Take 25 mg by mouth daily.    [provider]  ibuprofen (ADVIL,MOTRIN) 200 MG tablet Take 200 mg by mouth every 6 (six) hours as needed.    [provider]  lisinopril (PRINIVIL,ZESTRIL) 10 MG tablet Take 1 tablet by mouth daily. 12/26/15   [provider]  lubiprostone (AMITIZA) 8 MCG capsule Take 8 mcg by mouth 2 (two) times daily with a meal.    [provider]  omeprazole (PRILOSEC) 20 MG capsule Take 20 mg by mouth daily.    [provider]  potassium chloride (MICRO-K) 10 MEQ CR capsule Take 1 capsule by mouth daily.  12/29/15   [provider]  traMADol (ULTRAM) 50 MG tablet Take 1 tablet (50 mg total) by mouth every 6 (six) hours as needed. 08/01/17   Paulette Blanch, MD  TRAVATAN Z 0.004 % SOLN ophthalmic solution Place 1  drop into both eyes daily. 12/29/15   [provider]  vitamin C (ASCORBIC ACID) 500 MG tablet Take 500 mg by mouth daily.    [provider]    No Known Allergies  History reviewed. No pertinent family history.  Social History Social History   Tobacco Use  . Smoking status: Current Every Day Smoker    Packs/day: 0.50    Years: 10.00    Pack years: 5.00    Types: Cigarettes  . Smokeless tobacco: Never Used  Substance Use Topics  . Alcohol use: Yes    Comment: occasionally  . Drug use: No    Review of Systems Constitutional: Negative for  fever. Cardiovascular: Positive for chest pain Respiratory: Negative for shortness of breath. Gastrointestinal: Positive for abdominal pain, one episode of vomiting yesterday. Genitourinary: Negative for urinary compaints Musculoskeletal: Positive for back pain, arm and leg pain Skin: Negative for skin complaints  Neurological: Negative for headache All other ROS negative  ____________________________________________   PHYSICAL EXAM:  VITAL SIGNS: ED Triage Vitals  Enc Vitals Group     BP 09/10/18 1009 107/71     Pulse Rate 09/10/18 1009 (!) 102     Resp 09/10/18 1009 18     Temp 09/10/18 1009 98.3 F (36.8 C)     Temp Source 09/10/18 1009 Oral     SpO2 09/10/18 1009 100 %     Weight 09/10/18 1007 145 lb (65.8 kg)     Height 09/10/18 1007 5\' 8"  (1.727 m)     Head Circumference --      Peak Flow --      Pain Score 09/10/18 1007 10     Pain Loc --      Pain Edu? --      Excl. in [place]? --    Constitutional: Alert and oriented. Well appearing and in no distress. Eyes: Normal exam ENT   Head: Normocephalic and atraumatic.   Mouth/Throat: Mucous membranes are moist. Cardiovascular: Normal rate, regular rhythm. No murmur Respiratory: Normal respiratory effort without tachypnea nor retractions. Breath sounds are clear  Gastrointestinal: Soft and nontender. No distention.   Musculoskeletal: Nontender with normal range of motion in all extremities. Neurologic:  Normal speech and language. No gross focal neurologic deficits Skin:  Skin is warm, dry and intact.  Psychiatric: Mood and affect are normal.   ____________________________________________    EKG  EKG reviewed and interpreted by myself shows normal sinus rhythm at 100 bpm with a narrow QRS, normal axis, normal intervals, nonspecific without concerning ST changes.  ____________________________________________    RADIOLOGY  Chest x-ray negative  ____________________________________________   INITIAL  IMPRESSION / ASSESSMENT AND PLAN / ED COURSE  Pertinent labs & imaging results that were available during my care of the patient were reviewed by me and considered in my medical decision making (see chart for details).  Patient presents to the emergency department complains of pain all over her body including her chest and abdomen.  Has been taking Tylenol at home as well as drinking alcohol without relief.  No specific pain complaint.  When asked specifically if something hurts more than anywhere else she states it is her whole body.  Patient was sleeping upon my entering the room, when awoken she was complaining of pain.  We will check labs including cardiac enzymes as well as LFTs, lipase, urinalysis, obtain a chest x-ray and continue to closely monitor.  Patient's labs are resulted largely within normal limits.  Troponin negative, lipase negative,  urinalysis normal, hepatic function panel negative.  Patient's alcohol is slightly elevated at 83.  Chest x-ray is negative.  EKG is reassuring.  Patient states her pain is gone at this time.  I offered to refer her to a cardiologist and to a GI doctor however the patient states she follows up with [facility] clinic and would prefer to see her primary care doctor first.  I believe this is reasonable given her negative work-up in the emergency department.  We will discharge with PCP follow-up.  ____________________________________________   FINAL CLINICAL IMPRESSION(S) / ED DIAGNOSES  Chest pain Abdominal pain    Harvest Dark, MD 09/10/18 1435

## 2019-06-28 IMAGING — CT CT ANGIO CHEST
1 of 6 series · 18 of 36 positions shown · IV contrast (isovue)
Comparison: Chest radiograph June 15, 2017

CLINICAL DATA: Shortness of Breath

EXAM:
CT ANGIOGRAPHY CHEST WITH CONTRAST
TECHNIQUE: Multidetector CT imaging of the chest was performed using the
standard protocol during bolus administration of intravenous
contrast. Multiplanar CT image reconstructions and MIPs were
obtained to evaluate the vascular anatomy.
CONTRAST:  75 mL Isovue 370 nonionic

[Series 5: thins · axial · 0.72mm/px · z∈[-233,+24]mm · 18 of 287 slices shown]
[im 15/287  lung]
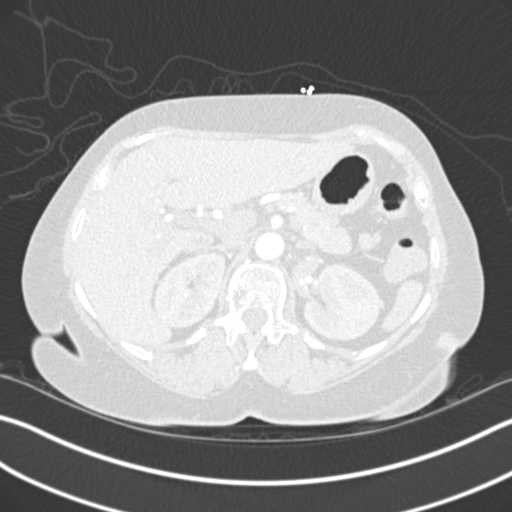
[im 29/287  mediastinal]
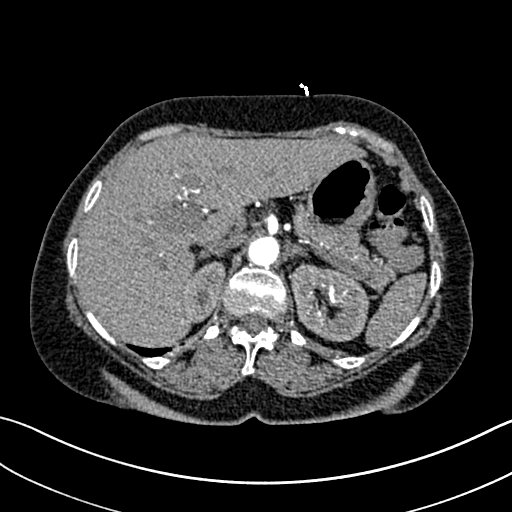
[im 43/287  lung]
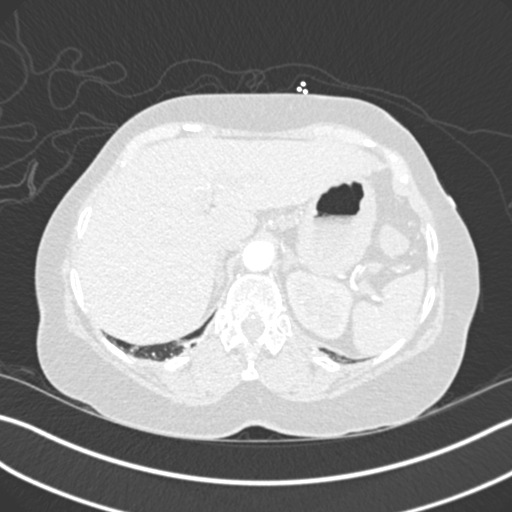
[im 58/287  mediastinal]
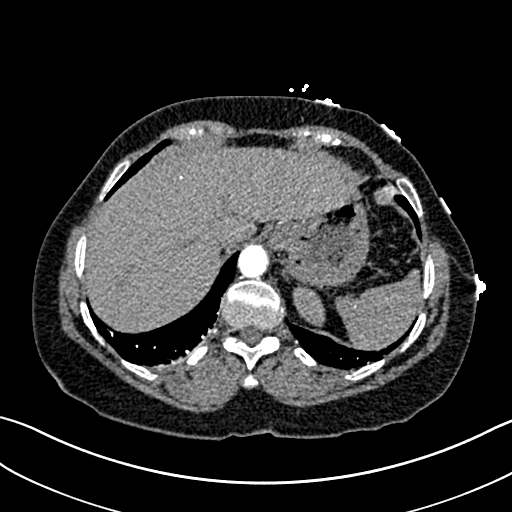
[im 72/287  lung]
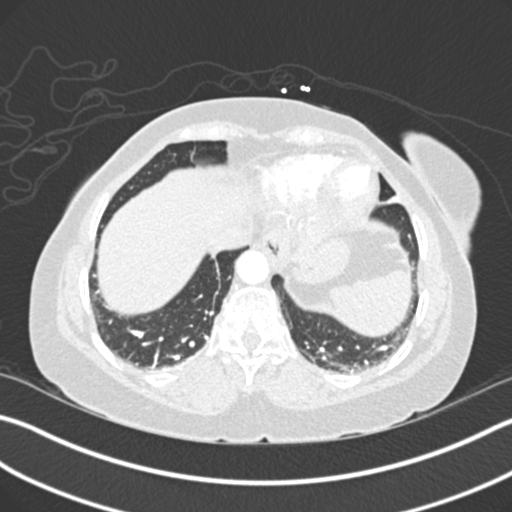
[im 86/287  mediastinal]
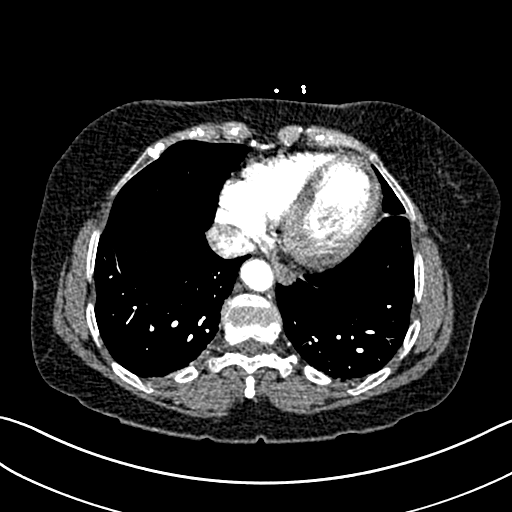
[im 101/287  lung]
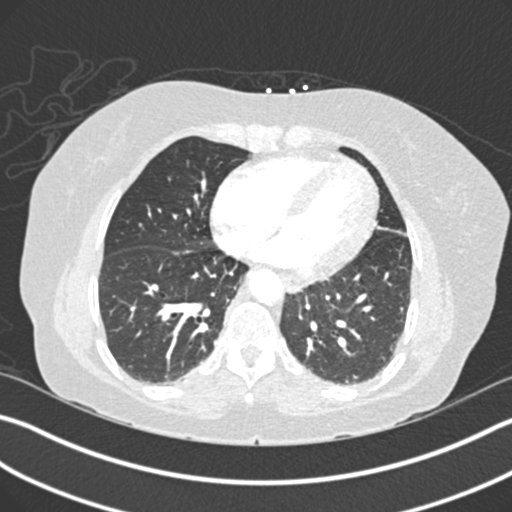
[im 115/287  mediastinal]
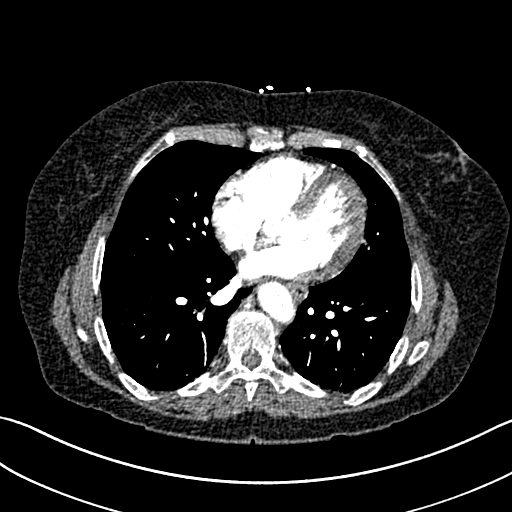
[im 129/287  lung]
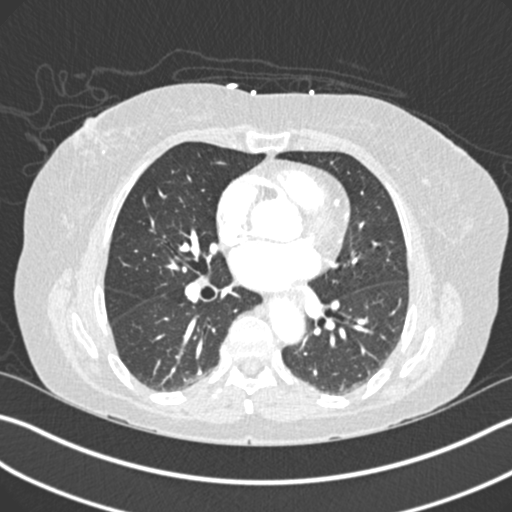
[im 158/287  mediastinal]
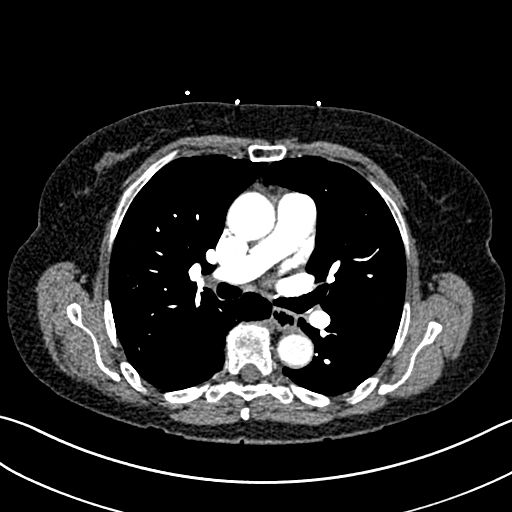
[im 172/287  lung]
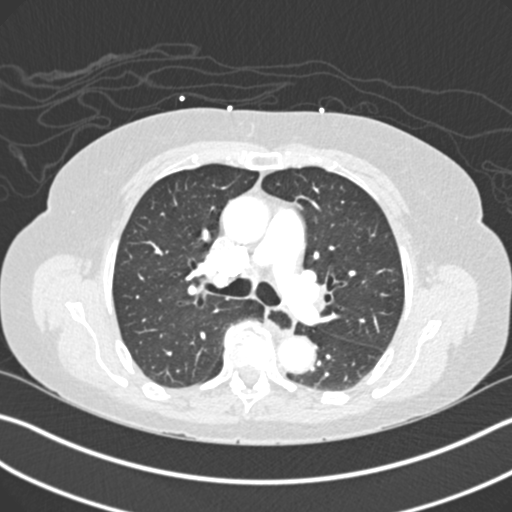
[im 186/287  mediastinal]
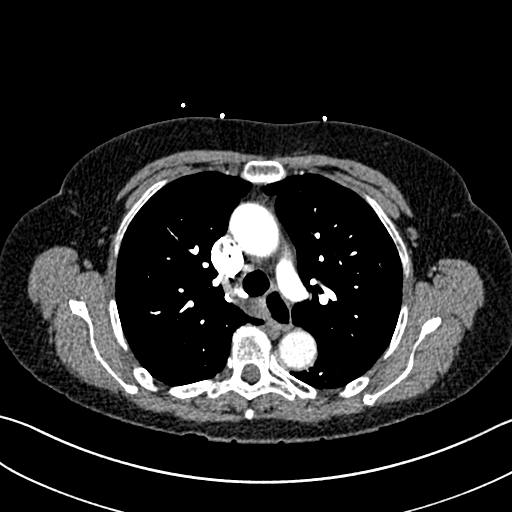
[im 201/287  lung]
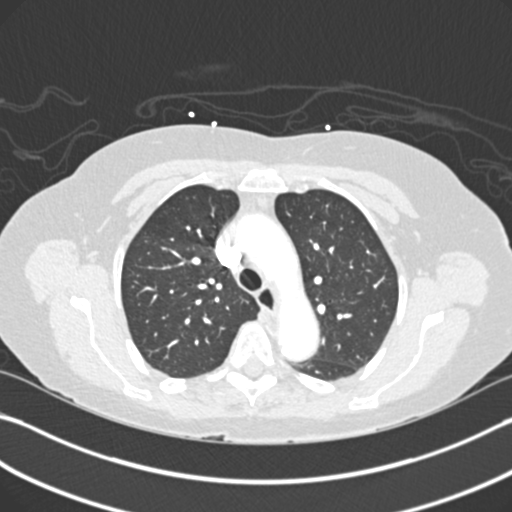
[im 215/287  mediastinal]
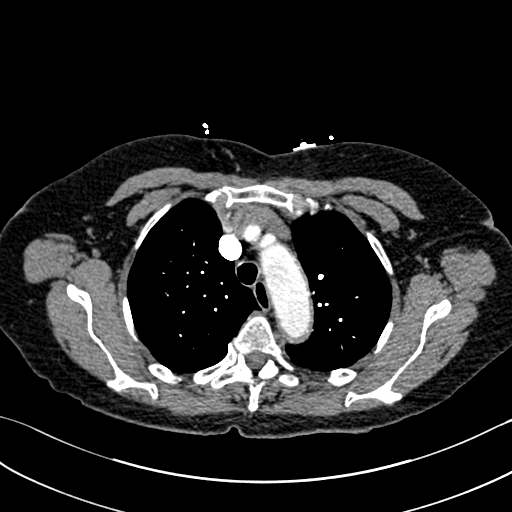
[im 229/287  lung]
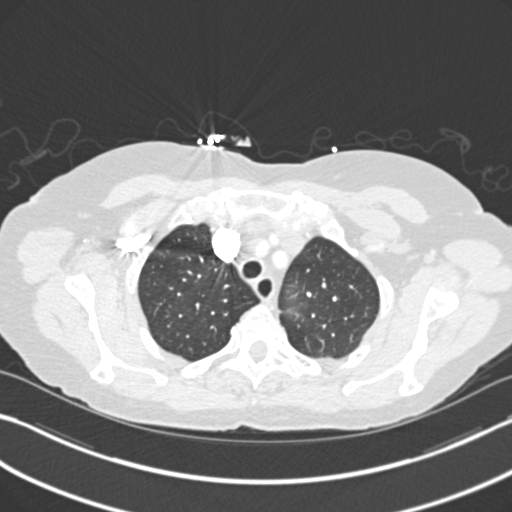
[im 244/287  mediastinal]
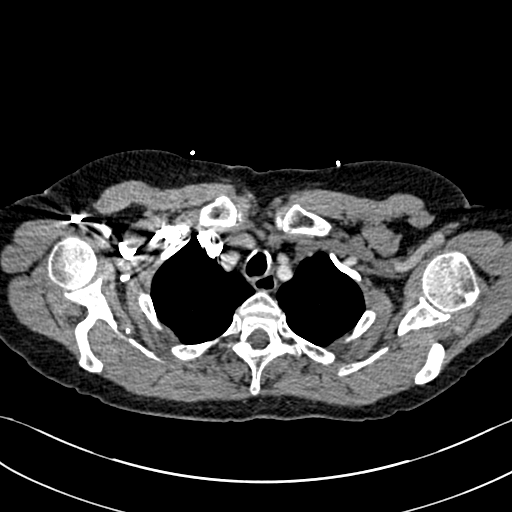
[im 258/287  lung]
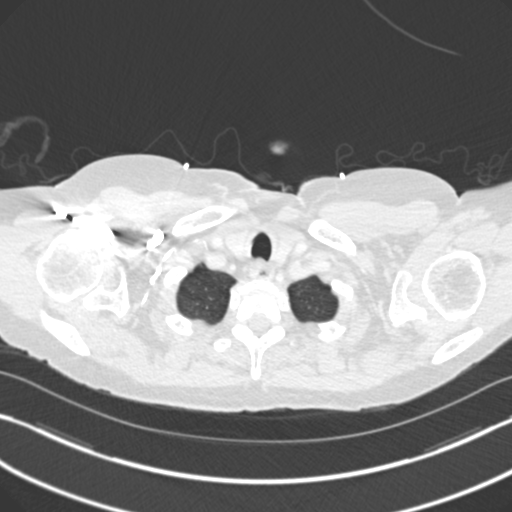
[im 272/287  mediastinal]
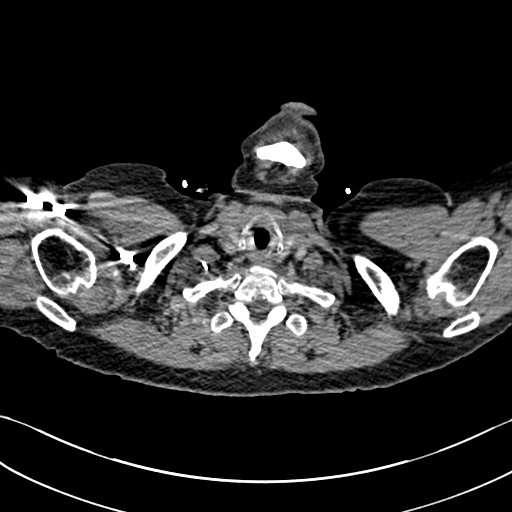

[18 of 36 positions shown; findings below may reference images not displayed]

FINDINGS: Cardiovascular: There is no demonstrable pulmonary embolus. There is
no appreciable thoracic aortic aneurysm or dissection. Visualized
great vessels appear unremarkable. There are foci of atherosclerotic
calcification in the aorta. Pericardium is not appreciably
thickened. There is slight coronary artery calcification.

Mediastinum/Nodes: Thyroid appears unremarkable. There is no
appreciable thoracic adenopathy. There is a small hiatal hernia.
There is moderate air in the midesophagus.

Lungs/Pleura: There is slight atelectatic change in scarring in the
lung bases. There is also slight scarring in the left upper lobe
anteriorly. There is no edema or consolidation. No pleural effusion
or pleural thickening evident.

Upper Abdomen: Visualized upper abdominal structures appear
unremarkable except for atherosclerotic calcification at the origin
of the left renal artery.

Musculoskeletal: There is degenerative change in the midthoracic
spine. There are no blastic or lytic bone lesions.

Review of the MIP images confirms the above findings.
IMPRESSION: 1.  No demonstrable pulmonary embolus.

2. Areas of aortic and great vessel atherosclerosis. Minimal
coronary artery calcification. No thoracic aortic aneurysm or
dissection.

3. Areas of mild atelectasis and scarring. No lung edema or
consolidation.

4.  No evident adenopathy.

5.  Small hiatal hernia.

6. There appears to be hemodynamically significant stenosis stable
calcification at the origin of the left renal artery. Question
whether patient is hypertensive in this regard.

Aortic Atherosclerosis ([id number]).
